# Patient Record
Sex: Male | Born: 1945 | Race: White | Hispanic: No | Marital: Single | Smoking: Current every day smoker
Health system: Southern US, Community
[De-identification: ages and names within clinical notes are randomized; demographics above are authoritative.]

## PROBLEM LIST (undated history)

## (undated) DIAGNOSIS — C4492 Squamous cell carcinoma of skin, unspecified: Secondary | ICD-10-CM

## (undated) DIAGNOSIS — E785 Hyperlipidemia, unspecified: Secondary | ICD-10-CM

## (undated) HISTORY — PX: HAND SURGERY: SHX662

## (undated) HISTORY — DX: Squamous cell carcinoma of skin, unspecified: C44.92

## (undated) HISTORY — PX: LEG SURGERY: SHX1003

---

## 2011-11-16 DIAGNOSIS — I2699 Other pulmonary embolism without acute cor pulmonale: Secondary | ICD-10-CM

## 2011-11-16 HISTORY — DX: Other pulmonary embolism without acute cor pulmonale: I26.99

## 2014-08-15 DIAGNOSIS — I219 Acute myocardial infarction, unspecified: Secondary | ICD-10-CM

## 2014-08-15 HISTORY — DX: Acute myocardial infarction, unspecified: I21.9

## 2014-08-15 HISTORY — PX: CARDIAC CATHETERIZATION: SHX172

## 2021-02-24 ENCOUNTER — Encounter: Payer: Self-pay | Admitting: Ophthalmology

## 2021-02-24 ENCOUNTER — Other Ambulatory Visit: Payer: Self-pay

## 2021-02-26 NOTE — Discharge Instructions (Signed)

## 2021-03-02 ENCOUNTER — Ambulatory Visit: Payer: Medicare HMO | Admitting: Anesthesiology

## 2021-03-02 ENCOUNTER — Other Ambulatory Visit: Payer: Self-pay

## 2021-03-02 ENCOUNTER — Ambulatory Visit
Admission: RE | Admit: 2021-03-02 | Discharge: 2021-03-02 | Disposition: A | Discharge status: Home / Self Care | Payer: Medicare HMO | Attending: Ophthalmology | Admitting: Ophthalmology

## 2021-03-02 ENCOUNTER — Encounter: Payer: Self-pay | Admitting: Ophthalmology

## 2021-03-02 ENCOUNTER — Encounter
Admission: RE | Disposition: A | Discharge status: Home / Self Care | Payer: Self-pay | Source: Home / Self Care | Attending: Ophthalmology

## 2021-03-02 DIAGNOSIS — F17210 Nicotine dependence, cigarettes, uncomplicated: Secondary | ICD-10-CM | POA: Insufficient documentation

## 2021-03-02 DIAGNOSIS — Z955 Presence of coronary angioplasty implant and graft: Secondary | ICD-10-CM | POA: Diagnosis not present

## 2021-03-02 DIAGNOSIS — Z7902 Long term (current) use of antithrombotics/antiplatelets: Secondary | ICD-10-CM | POA: Insufficient documentation

## 2021-03-02 DIAGNOSIS — Z86711 Personal history of pulmonary embolism: Secondary | ICD-10-CM | POA: Diagnosis not present

## 2021-03-02 DIAGNOSIS — H2512 Age-related nuclear cataract, left eye: Secondary | ICD-10-CM | POA: Insufficient documentation

## 2021-03-02 DIAGNOSIS — Z79899 Other long term (current) drug therapy: Secondary | ICD-10-CM | POA: Diagnosis not present

## 2021-03-02 HISTORY — PX: CATARACT EXTRACTION W/PHACO: SHX586

## 2021-03-02 SURGERY — PHACOEMULSIFICATION, CATARACT, WITH IOL INSERTION
Anesthesia: Monitor Anesthesia Care | Site: Eye | Laterality: Left

## 2021-03-02 MED ORDER — MOXIFLOXACIN HCL 0.5 % OP SOLN
OPHTHALMIC | Status: DC | PRN
  Administered 2021-03-02: 0.2 mL via OPHTHALMIC

## 2021-03-02 MED ORDER — MIDAZOLAM HCL 2 MG/2ML IJ SOLN
INTRAMUSCULAR | Status: DC | PRN
  Administered 2021-03-02: 2 mg via INTRAVENOUS

## 2021-03-02 MED ORDER — LACTATED RINGERS IV SOLN: INTRAVENOUS | Status: DC

## 2021-03-02 MED ORDER — OXYCODONE HCL 5 MG PO TABS: 5.0000 mg | ORAL_TABLET | Freq: Once | ORAL | Status: DC | PRN

## 2021-03-02 MED ORDER — ARMC OPHTHALMIC DILATING DROPS
1.0000 "application " | OPHTHALMIC | Status: DC | PRN
  Administered 2021-03-02 (×3): 1 via OPHTHALMIC

## 2021-03-02 MED ORDER — EPINEPHRINE PF 1 MG/ML IJ SOLN
INTRAOCULAR | Status: DC | PRN
  Administered 2021-03-02: 89 mL via OPHTHALMIC

## 2021-03-02 MED ORDER — OXYCODONE HCL 5 MG/5ML PO SOLN: 5.0000 mg | Freq: Once | ORAL | Status: DC | PRN

## 2021-03-02 MED ORDER — FENTANYL CITRATE (PF) 100 MCG/2ML IJ SOLN
INTRAMUSCULAR | Status: DC | PRN
  Administered 2021-03-02: 100 ug via INTRAVENOUS

## 2021-03-02 MED ORDER — SODIUM HYALURONATE 10 MG/ML IO SOLN
INTRAOCULAR | Status: DC | PRN
  Administered 2021-03-02: 0.55 mL via INTRAOCULAR

## 2021-03-02 MED ORDER — SODIUM HYALURONATE 23 MG/ML IO SOLN
INTRAOCULAR | Status: DC | PRN
  Administered 2021-03-02: 0.6 mL via INTRAOCULAR

## 2021-03-02 MED ORDER — TETRACAINE HCL 0.5 % OP SOLN
1.0000 [drp] | OPHTHALMIC | Status: DC | PRN
  Administered 2021-03-02 (×3): 1 [drp] via OPHTHALMIC

## 2021-03-02 MED ORDER — LIDOCAINE HCL (PF) 2 % IJ SOLN
INTRAOCULAR | Status: DC | PRN
  Administered 2021-03-02: 1 mL via INTRAOCULAR

## 2021-03-02 SURGICAL SUPPLY — 17 items
CANNULA ANT/CHMB 27GA (MISCELLANEOUS) ×4 IMPLANT
DISSECTOR HYDRO NUCLEUS 50X22 (MISCELLANEOUS) ×2 IMPLANT
GLOVE PI ULTRA LF STRL 7.5 (GLOVE) ×2 IMPLANT
GLOVE PI ULTRA NON LATEX 7.5 (GLOVE) ×2
GLOVE SURG SYN 8.5  E (GLOVE) ×1
GLOVE SURG SYN 8.5 E (GLOVE) ×1 IMPLANT
GOWN STRL REUS W/ TWL LRG LVL3 (GOWN DISPOSABLE) ×2 IMPLANT
GOWN STRL REUS W/TWL LRG LVL3 (GOWN DISPOSABLE) ×4
LENS IOL TECNIS EYHANCE 18.5 (Intraocular Lens) ×2 IMPLANT
MARKER SKIN DUAL TIP RULER LAB (MISCELLANEOUS) ×2 IMPLANT
PACK DR. KING ARMS (PACKS) ×2 IMPLANT
PACK EYE AFTER SURG (MISCELLANEOUS) ×2 IMPLANT
PACK OPTHALMIC (MISCELLANEOUS) ×2 IMPLANT
SYR 3ML LL SCALE MARK (SYRINGE) ×2 IMPLANT
SYR TB 1ML LUER SLIP (SYRINGE) ×2 IMPLANT
WATER STERILE IRR 250ML POUR (IV SOLUTION) ×2 IMPLANT
WIPE NON LINTING 3.25X3.25 (MISCELLANEOUS) ×2 IMPLANT

## 2021-03-02 NOTE — Op Note (Signed)
OPERATIVE NOTE  Gary Andersen [id number] 03/02/2021   PREOPERATIVE DIAGNOSIS:  Nuclear sclerotic cataract left eye.  H25.12   POSTOPERATIVE DIAGNOSIS:    Nuclear sclerotic cataract left eye.     PROCEDURE:  Phacoemusification with posterior chamber intraocular lens placement of the left eye   LENS:   Implant Name Type Inv. Item Serial No. Manufacturer Lot No. LRB No. Used Action  LENS IOL TECNIS EYHANCE 18.5 - [id number] Intraocular Lens LENS IOL TECNIS EYHANCE 18.5 [id number] JOHNSON   Left 1 Implanted      Procedure(s) with comments: CATARACT EXTRACTION PHACO AND INTRAOCULAR LENS PLACEMENT (IOC) LEFT (Left) - 2.21 0:21.8  DIB00 +18.5   ULTRASOUND TIME: 0 minutes 21 seconds.  CDE 2.21   SURGEON:  Willey Blade, MD, MPH   ANESTHESIA:  Topical with tetracaine drops augmented with 1% preservative-free intracameral lidocaine.  ESTIMATED BLOOD LOSS: <1 mL   COMPLICATIONS:  None.   DESCRIPTION OF PROCEDURE:  The patient was identified in the holding room and transported to the operating room and placed in the supine position under the operating microscope.  The left eye was identified as the operative eye and it was prepped and draped in the usual sterile ophthalmic fashion.   A 1.0 millimeter clear-corneal paracentesis was made at the 5:00 position. 0.5 ml of preservative-free 1% lidocaine with epinephrine was injected into the anterior chamber.  The anterior chamber was filled with Healon 5 viscoelastic.  A 2.4 millimeter keratome was used to make a near-clear corneal incision at the 2:00 position.  A curvilinear capsulorrhexis was made with a cystotome and capsulorrhexis forceps.  Balanced salt solution was used to hydrodissect and hydrodelineate the nucleus.   Phacoemulsification was then used in stop and chop fashion to remove the lens nucleus and epinucleus.  The remaining cortex was then removed using the irrigation and aspiration handpiece. Healon was then placed into the  capsular bag to distend it for lens placement.  A lens was then injected into the capsular bag.  The remaining viscoelastic was aspirated.   Wounds were hydrated with balanced salt solution.  The anterior chamber was inflated to a physiologic pressure with balanced salt solution.  Intracameral vigamox 0.1 mL undiltued was injected into the eye and a drop placed onto the ocular surface.  No wound leaks were noted.  The patient was taken to the recovery room in stable condition without complications of anesthesia or surgery  Willey Blade 03/02/2021, 8:48 AM

## 2021-03-02 NOTE — Anesthesia Procedure Notes (Signed)
Procedure Name: MAC Date/Time: 03/02/2021 8:30 AM Performed by: Silvana Newness, CRNA Pre-anesthesia Checklist: Patient identified, Emergency Drugs available, Suction available, Patient being monitored and Timeout performed Patient Re-evaluated:Patient Re-evaluated prior to induction Oxygen Delivery Method: Nasal cannula Placement Confirmation: positive ETCO2

## 2021-03-02 NOTE — Anesthesia Postprocedure Evaluation (Signed)
Anesthesia Post Note  Patient: Gary Andersen  Procedure(s) Performed: CATARACT EXTRACTION PHACO AND INTRAOCULAR LENS PLACEMENT (IOC) LEFT (Left Eye)     Patient location during evaluation: PACU Anesthesia Type: MAC Level of consciousness: awake and alert Pain management: pain level controlled Vital Signs Assessment: post-procedure vital signs reviewed and stable Respiratory status: spontaneous breathing, nonlabored ventilation, respiratory function stable and patient connected to nasal cannula oxygen Cardiovascular status: stable and blood pressure returned to baseline Postop Assessment: no apparent nausea or vomiting Anesthetic complications: no   No complications documented.  Fidel Levy

## 2021-03-02 NOTE — H&P (Signed)
[facility]   Primary Care Physician:  Armando Gang, FNP Ophthalmologist: Dr. Willey Blade  Pre-Procedure History & Physical: HPI:  Gary Andersen is a 75 y.o. male here for cataract surgery.   Past Medical History:  Diagnosis Date  . Myocardial infarction (HCC) 08/2014  . Pulmonary embolism (HCC) 2013    Past Surgical History:  Procedure Laterality Date  . CARDIAC CATHETERIZATION  08/2014   x3 between 08/2014 and 11/2014.  3 stents    Prior to Admission medications   Medication Sig Start Date End Date Taking? Authorizing Provider  atorvastatin (LIPITOR) 40 MG tablet Take 40 mg by mouth daily.   Yes [provider]  clopidogrel (PLAVIX) 75 MG tablet Take 75 mg by mouth daily.   Yes [provider]  finasteride (PROSCAR) 5 MG tablet Take 5 mg by mouth daily.   Yes [provider]    Allergies as of 01/29/2021  . (Not on File)    History reviewed. No pertinent family history.  Social History   Socioeconomic History  . Marital status: Single    Spouse name: Not on file  . Number of children: Not on file  . Years of education: Not on file  . Highest education level: Not on file  Occupational History  . Not on file  Tobacco Use  . Smoking status: Current Every Day Smoker    Packs/day: 1.00    Years: 63.00    Pack years: 63.00    Types: Cigarettes  . Smokeless tobacco: Never Used  . Tobacco comment: started age 32  Vaping Use  . Vaping Use: Never used  Substance and Sexual Activity  . Alcohol use: Not Currently  . Drug use: Not on file  . Sexual activity: Not on file  Other Topics Concern  . Not on file  Social History Narrative  . Not on file   Social Determinants of Health   Financial Resource Strain: Not on file  Food Insecurity: Not on file  Transportation Needs: Not on file  Physical Activity: Not on file  Stress: Not on file  Social Connections: Not on file  Intimate Partner Violence: Not on file    Review  of Systems: See HPI, otherwise negative ROS  Physical Exam: BP 131/75   Pulse (!) 57   Temp (!) 97 F (36.1 C) (Temporal)   Resp 18   Ht 5' 11.73" (1.822 m)   Wt 89.4 kg   SpO2 99%   BMI 26.92 kg/m  General:   Alert,  pleasant and cooperative in NAD Head:  Normocephalic and atraumatic. Respiratory:  Normal work of breathing. Cardiovascular:  RRR  Impression/Plan: Gary Andersen is here for cataract surgery.  Risks, benefits, limitations, and alternatives regarding cataract surgery have been reviewed with the patient.  Questions have been answered.  All parties agreeable.   Willey Blade, MD  03/02/2021, 8:19 AM

## 2021-03-02 NOTE — Transfer of Care (Signed)
Immediate Anesthesia Transfer of Care Note  Patient: Gary Andersen  Procedure(s) Performed: CATARACT EXTRACTION PHACO AND INTRAOCULAR LENS PLACEMENT (IOC) LEFT (Left Eye)  Patient Location: PACU  Anesthesia Type: MAC  Level of Consciousness: awake, alert  and patient cooperative  Airway and Oxygen Therapy: Patient Spontanous Breathing and Patient connected to supplemental oxygen  Post-op Assessment: Post-op Vital signs reviewed, Patient's Cardiovascular Status Stable, Respiratory Function Stable, Patent Airway and No signs of Nausea or vomiting  Post-op Vital Signs: Reviewed and stable  Complications: No complications documented.

## 2021-03-02 NOTE — Anesthesia Preprocedure Evaluation (Signed)
Anesthesia Evaluation  Patient identified by MRN, date of birth, ID band Patient awake    Reviewed: NPO status   Airway Mallampati: II  TM Distance: >3 FB Neck ROM: full    Dental  (+) Edentulous Upper, Edentulous Lower +Beard:   Pulmonary Current Smoker and Patient abstained from smoking.,    Pulmonary exam normal        Cardiovascular Exercise Tolerance: Good + Past MI (2014), + Cardiac Stents (x3 2015) and + DVT (PE 2013)  Normal cardiovascular exam     Neuro/Psych negative neurological ROS  negative psych ROS   GI/Hepatic negative GI ROS, Neg liver ROS,   Endo/Other  Morbid obesity (bmi 27)  Renal/GU negative Renal ROS  negative genitourinary   Musculoskeletal   Abdominal   Peds  Hematology negative hematology ROS (+)   Anesthesia Other Findings Last plavix: 4/18   Reproductive/Obstetrics                             Anesthesia Physical Anesthesia Plan  ASA: III  Anesthesia Plan: MAC   Post-op Pain Management:    Induction:   PONV Risk Score and Plan: 1 and Midazolam  Airway Management Planned:   Additional Equipment:   Intra-op Plan:   Post-operative Plan:   Informed Consent: I have reviewed the patients History and Physical, chart, labs and discussed the procedure including the risks, benefits and alternatives for the proposed anesthesia with the patient or authorized representative who has indicated his/her understanding and acceptance.       Plan Discussed with: CRNA  Anesthesia Plan Comments:         Anesthesia Quick Evaluation

## 2021-03-03 ENCOUNTER — Encounter: Payer: Self-pay | Admitting: Ophthalmology

## 2021-03-05 ENCOUNTER — Other Ambulatory Visit: Payer: Self-pay

## 2021-03-05 ENCOUNTER — Encounter: Payer: Self-pay | Admitting: Ophthalmology

## 2021-03-12 NOTE — Discharge Instructions (Signed)

## 2021-03-16 ENCOUNTER — Ambulatory Visit
Admission: RE | Admit: 2021-03-16 | Discharge: 2021-03-16 | Disposition: A | Discharge status: Home / Self Care | Payer: Medicare HMO | Attending: Ophthalmology | Admitting: Ophthalmology

## 2021-03-16 ENCOUNTER — Ambulatory Visit: Payer: Medicare HMO | Admitting: Anesthesiology

## 2021-03-16 ENCOUNTER — Encounter
Admission: RE | Disposition: A | Discharge status: Home / Self Care | Payer: Self-pay | Source: Home / Self Care | Attending: Ophthalmology

## 2021-03-16 ENCOUNTER — Other Ambulatory Visit: Payer: Self-pay

## 2021-03-16 ENCOUNTER — Encounter: Payer: Self-pay | Admitting: Ophthalmology

## 2021-03-16 DIAGNOSIS — F17210 Nicotine dependence, cigarettes, uncomplicated: Secondary | ICD-10-CM | POA: Diagnosis not present

## 2021-03-16 DIAGNOSIS — H2511 Age-related nuclear cataract, right eye: Secondary | ICD-10-CM | POA: Insufficient documentation

## 2021-03-16 DIAGNOSIS — Z86711 Personal history of pulmonary embolism: Secondary | ICD-10-CM | POA: Diagnosis not present

## 2021-03-16 DIAGNOSIS — Z7902 Long term (current) use of antithrombotics/antiplatelets: Secondary | ICD-10-CM | POA: Diagnosis not present

## 2021-03-16 DIAGNOSIS — Z79899 Other long term (current) drug therapy: Secondary | ICD-10-CM | POA: Insufficient documentation

## 2021-03-16 HISTORY — PX: CATARACT EXTRACTION W/PHACO: SHX586

## 2021-03-16 SURGERY — PHACOEMULSIFICATION, CATARACT, WITH IOL INSERTION
Anesthesia: Monitor Anesthesia Care | Site: Eye | Laterality: Right

## 2021-03-16 MED ORDER — FENTANYL CITRATE (PF) 100 MCG/2ML IJ SOLN
INTRAMUSCULAR | Status: DC | PRN
  Administered 2021-03-16 (×2): 50 ug via INTRAVENOUS

## 2021-03-16 MED ORDER — ONDANSETRON HCL 4 MG/2ML IJ SOLN: 4.0000 mg | Freq: Once | INTRAMUSCULAR | Status: DC | PRN

## 2021-03-16 MED ORDER — SODIUM HYALURONATE 23MG/ML IO SOSY
PREFILLED_SYRINGE | INTRAOCULAR | Status: DC | PRN
  Administered 2021-03-16: 0.6 mL via INTRAOCULAR

## 2021-03-16 MED ORDER — TETRACAINE HCL 0.5 % OP SOLN
1.0000 [drp] | OPHTHALMIC | Status: DC | PRN
  Administered 2021-03-16 (×3): 1 [drp] via OPHTHALMIC

## 2021-03-16 MED ORDER — ARMC OPHTHALMIC DILATING DROPS
1.0000 "application " | OPHTHALMIC | Status: DC | PRN
  Administered 2021-03-16 (×3): 1 via OPHTHALMIC

## 2021-03-16 MED ORDER — ACETAMINOPHEN 160 MG/5ML PO SOLN: 325.0000 mg | ORAL | Status: DC | PRN

## 2021-03-16 MED ORDER — ACETAMINOPHEN 325 MG PO TABS: 325.0000 mg | ORAL_TABLET | ORAL | Status: DC | PRN

## 2021-03-16 MED ORDER — LIDOCAINE HCL (PF) 2 % IJ SOLN
INTRAOCULAR | Status: DC | PRN
  Administered 2021-03-16: 1 mL via INTRAOCULAR

## 2021-03-16 MED ORDER — MIDAZOLAM HCL 2 MG/2ML IJ SOLN
INTRAMUSCULAR | Status: DC | PRN
  Administered 2021-03-16: 2 mg via INTRAVENOUS

## 2021-03-16 MED ORDER — EPINEPHRINE PF 1 MG/ML IJ SOLN
INTRAOCULAR | Status: DC | PRN
  Administered 2021-03-16: 80 mL via OPHTHALMIC

## 2021-03-16 MED ORDER — SODIUM HYALURONATE 10 MG/ML IO SOLUTION
PREFILLED_SYRINGE | INTRAOCULAR | Status: DC | PRN
  Administered 2021-03-16: 0.55 mL via INTRAOCULAR

## 2021-03-16 MED ORDER — MOXIFLOXACIN HCL 0.5 % OP SOLN
OPHTHALMIC | Status: DC | PRN
  Administered 2021-03-16: 0.2 mL via OPHTHALMIC

## 2021-03-16 SURGICAL SUPPLY — 17 items
CANNULA ANT/CHMB 27GA (MISCELLANEOUS) ×4 IMPLANT
DISSECTOR HYDRO NUCLEUS 50X22 (MISCELLANEOUS) ×2 IMPLANT
GLOVE PI ULTRA LF STRL 7.5 (GLOVE) ×2 IMPLANT
GLOVE PI ULTRA NON LATEX 7.5 (GLOVE) ×2
GLOVE SURG SYN 8.5  E (GLOVE) ×1
GLOVE SURG SYN 8.5 E (GLOVE) ×1 IMPLANT
GOWN STRL REUS W/ TWL LRG LVL3 (GOWN DISPOSABLE) ×2 IMPLANT
GOWN STRL REUS W/TWL LRG LVL3 (GOWN DISPOSABLE) ×4
LENS IOL TECNIS EYHANCE 19.0 (Intraocular Lens) ×2 IMPLANT
MARKER SKIN DUAL TIP RULER LAB (MISCELLANEOUS) ×2 IMPLANT
PACK DR. KING ARMS (PACKS) ×2 IMPLANT
PACK EYE AFTER SURG (MISCELLANEOUS) ×2 IMPLANT
PACK OPTHALMIC (MISCELLANEOUS) ×2 IMPLANT
SYR 3ML LL SCALE MARK (SYRINGE) ×2 IMPLANT
SYR TB 1ML LUER SLIP (SYRINGE) ×2 IMPLANT
WATER STERILE IRR 250ML POUR (IV SOLUTION) ×2 IMPLANT
WIPE NON LINTING 3.25X3.25 (MISCELLANEOUS) ×2 IMPLANT

## 2021-03-16 NOTE — Anesthesia Postprocedure Evaluation (Signed)
Anesthesia Post Note  Patient: Gary Andersen  Procedure(s) Performed: CATARACT EXTRACTION PHACO AND INTRAOCULAR LENS PLACEMENT (IOC)  RIGHT (Right Eye)     Patient location during evaluation: PACU Anesthesia Type: MAC Level of consciousness: awake Pain management: pain level controlled Vital Signs Assessment: post-procedure vital signs reviewed and stable Respiratory status: respiratory function stable Cardiovascular status: stable Postop Assessment: no signs of nausea or vomiting Anesthetic complications: no   No complications documented.  Veda Canning

## 2021-03-16 NOTE — H&P (Signed)
[facility]   Primary Care Physician:  Armando Gang, FNP Ophthalmologist: Dr. Willey Blade  Pre-Procedure History & Physical: HPI:  Gary Andersen is a 75 y.o. male here for cataract surgery.   Past Medical History:  Diagnosis Date  . Myocardial infarction (HCC) 08/2014  . Pulmonary embolism (HCC) 2013    Past Surgical History:  Procedure Laterality Date  . CARDIAC CATHETERIZATION  08/2014   x3 between 08/2014 and 11/2014.  3 stents  . CATARACT EXTRACTION W/PHACO Left 03/02/2021   Procedure: CATARACT EXTRACTION PHACO AND INTRAOCULAR LENS PLACEMENT (IOC) LEFT;  Surgeon: Nevada Crane, MD;  Location: [facility];  Service: Ophthalmology;  Laterality: Left;  2.21 0:21.8    Prior to Admission medications   Medication Sig Start Date End Date Taking? Authorizing Provider  atorvastatin (LIPITOR) 40 MG tablet Take 40 mg by mouth daily.   Yes [provider]  clopidogrel (PLAVIX) 75 MG tablet Take 75 mg by mouth daily.   Yes [provider]  finasteride (PROSCAR) 5 MG tablet Take 5 mg by mouth daily.   Yes [provider]    Allergies as of 01/29/2021  . (Not on File)    History reviewed. No pertinent family history.  Social History   Socioeconomic History  . Marital status: Single    Spouse name: Not on file  . Number of children: Not on file  . Years of education: Not on file  . Highest education level: Not on file  Occupational History  . Not on file  Tobacco Use  . Smoking status: Current Every Day Smoker    Packs/day: 1.00    Years: 63.00    Pack years: 63.00    Types: Cigarettes  . Smokeless tobacco: Never Used  . Tobacco comment: started age 60  Vaping Use  . Vaping Use: Never used  Substance and Sexual Activity  . Alcohol use: Not Currently  . Drug use: Not on file  . Sexual activity: Not on file  Other Topics Concern  . Not on file  Social History Narrative  . Not on file   Social Determinants of  Health   Financial Resource Strain: Not on file  Food Insecurity: Not on file  Transportation Needs: Not on file  Physical Activity: Not on file  Stress: Not on file  Social Connections: Not on file  Intimate Partner Violence: Not on file    Review of Systems: See HPI, otherwise negative ROS  Physical Exam: BP 129/71   Pulse (!) 51   Temp 97.8 F (36.6 C) (Temporal)   Resp 18   Ht 5\' 11"  (1.803 m)   Wt 87.9 kg   SpO2 99%   BMI 27.02 kg/m  General:   Alert,  pleasant and cooperative in NAD Head:  Normocephalic and atraumatic. Respiratory:  Normal work of breathing. Cardiovascular:  RRR  Impression/Plan: is here for cataract surgery.  Risks, benefits, limitations, and alternatives regarding cataract surgery have been reviewed with the patient.  Questions have been answered.  All parties agreeable.   Karlton Lemon, MD  03/16/2021, 10:35 AM

## 2021-03-16 NOTE — Anesthesia Procedure Notes (Signed)
Procedure Name: MAC Date/Time: 03/16/2021 10:48 AM Performed by: Jeannene Patella, CRNA Pre-anesthesia Checklist: Patient identified, Emergency Drugs available, Suction available, Timeout performed and Patient being monitored Patient Re-evaluated:Patient Re-evaluated prior to induction Oxygen Delivery Method: Nasal cannula Placement Confirmation: positive ETCO2

## 2021-03-16 NOTE — Anesthesia Preprocedure Evaluation (Signed)
Anesthesia Evaluation  Patient identified by MRN, date of birth, ID band Patient awake    Reviewed: NPO status   Airway Mallampati: II  TM Distance: >3 FB Neck ROM: full    Dental  (+) Edentulous Upper, Edentulous Lower +Beard:   Pulmonary Current Smoker and Patient abstained from smoking.,    Pulmonary exam normal        Cardiovascular Exercise Tolerance: Good + Past MI (2014), + Cardiac Stents (x3 2015) and + DVT (PE 2013)  Normal cardiovascular exam     Neuro/Psych    GI/Hepatic   Endo/Other  Morbid obesity (bmi 27)  Renal/GU      Musculoskeletal   Abdominal   Peds  Hematology   Anesthesia Other Findings Last plavix: 4/18   Reproductive/Obstetrics                             Anesthesia Physical  Anesthesia Plan  ASA: III  Anesthesia Plan: MAC   Post-op Pain Management:    Induction:   PONV Risk Score and Plan: 1 and Midazolam and Treatment may vary due to age or medical condition  Airway Management Planned: Natural Airway and Nasal Cannula  Additional Equipment:   Intra-op Plan:   Post-operative Plan:   Informed Consent: I have reviewed the patients History and Physical, chart, labs and discussed the procedure including the risks, benefits and alternatives for the proposed anesthesia with the patient or authorized representative who has indicated his/her understanding and acceptance.       Plan Discussed with: CRNA  Anesthesia Plan Comments:         Anesthesia Quick Evaluation

## 2021-03-16 NOTE — Transfer of Care (Signed)
Immediate Anesthesia Transfer of Care Note  Patient: Gary Andersen  Procedure(s) Performed: CATARACT EXTRACTION PHACO AND INTRAOCULAR LENS PLACEMENT (IOC)  RIGHT (Right Eye)  Patient Location: PACU  Anesthesia Type: MAC  Level of Consciousness: awake, alert  and patient cooperative  Airway and Oxygen Therapy: Patient Spontanous Breathing and Patient connected to supplemental oxygen  Post-op Assessment: Post-op Vital signs reviewed, Patient's Cardiovascular Status Stable, Respiratory Function Stable, Patent Airway and No signs of Nausea or vomiting  Post-op Vital Signs: Reviewed and stable  Complications: No complications documented.

## 2021-03-16 NOTE — Op Note (Signed)
OPERATIVE NOTE  Agapito Hanway [id number] 03/16/2021   PREOPERATIVE DIAGNOSIS:  Nuclear sclerotic cataract right eye.  H25.11   POSTOPERATIVE DIAGNOSIS:    Nuclear sclerotic cataract right eye.     PROCEDURE:  Phacoemusification with posterior chamber intraocular lens placement of the right eye   LENS:   Implant Name Type Inv. Item Serial No. Manufacturer Lot No. LRB No. Used Action  LENS IOL TECNIS EYHANCE 19.0 - [id number] Intraocular Lens LENS IOL TECNIS EYHANCE 19.0 [id number] [id number]   Right 1 Implanted       Procedure(s) with comments: CATARACT EXTRACTION PHACO AND INTRAOCULAR LENS PLACEMENT (IOC)  RIGHT (Right) - 3.11 0:27.4  DIB00 +19.0   ULTRASOUND TIME: 0 minutes 27 seconds.  CDE 3.11   SURGEON:  Willey Blade, MD, MPH  ANESTHESIOLOGIST: Anesthesiologist: Jola Babinski, MD CRNA: Jinny Blossom, CRNA   ANESTHESIA:  Topical with tetracaine drops augmented with 1% preservative-free intracameral lidocaine.  ESTIMATED BLOOD LOSS: less than 1 mL.   COMPLICATIONS:  None.   DESCRIPTION OF PROCEDURE:  The patient was identified in the holding room and transported to the operating room and placed in the supine position under the operating microscope.  The right eye was identified as the operative eye and it was prepped and draped in the usual sterile ophthalmic fashion.   A 1.0 millimeter clear-corneal paracentesis was made at the 10:30 position. 0.5 ml of preservative-free 1% lidocaine with epinephrine was injected into the anterior chamber.  The anterior chamber was filled with Healon 5 viscoelastic.  A 2.4 millimeter keratome was used to make a near-clear corneal incision at the 8:00 position.  A curvilinear capsulorrhexis was made with a cystotome and capsulorrhexis forceps.  Balanced salt solution was used to hydrodissect and hydrodelineate the nucleus.   Phacoemulsification was then used in stop and chop fashion to remove the lens nucleus and epinucleus.  The  remaining cortex was then removed using the irrigation and aspiration handpiece. Healon was then placed into the capsular bag to distend it for lens placement.  A lens was then injected into the capsular bag.  The remaining viscoelastic was aspirated.   Wounds were hydrated with balanced salt solution.  The anterior chamber was inflated to a physiologic pressure with balanced salt solution.   Intracameral vigamox 0.1 mL undiluted was injected into the eye and a drop placed onto the ocular surface.  No wound leaks were noted.  The patient was taken to the recovery room in stable condition without complications of anesthesia or surgery  Willey Blade 03/16/2021, 11:04 AM

## 2021-03-17 ENCOUNTER — Encounter: Payer: Self-pay | Admitting: Ophthalmology

## 2021-05-10 ENCOUNTER — Emergency Department: Payer: Medicare HMO

## 2021-05-10 ENCOUNTER — Emergency Department
Admission: EM | Admit: 2021-05-10 | Discharge: 2021-05-10 | Discharge status: Against Medical Advice | Payer: Medicare HMO | Attending: Student in an Organized Health Care Education/Training Program | Admitting: Student in an Organized Health Care Education/Training Program

## 2021-05-10 ENCOUNTER — Other Ambulatory Visit: Payer: Self-pay

## 2021-05-10 ENCOUNTER — Encounter: Payer: Self-pay | Admitting: Emergency Medicine

## 2021-05-10 DIAGNOSIS — R0789 Other chest pain: Secondary | ICD-10-CM | POA: Diagnosis not present

## 2021-05-10 DIAGNOSIS — R079 Chest pain, unspecified: Secondary | ICD-10-CM | POA: Diagnosis present

## 2021-05-10 DIAGNOSIS — M549 Dorsalgia, unspecified: Secondary | ICD-10-CM | POA: Insufficient documentation

## 2021-05-10 DIAGNOSIS — F17210 Nicotine dependence, cigarettes, uncomplicated: Secondary | ICD-10-CM | POA: Insufficient documentation

## 2021-05-10 LAB — CBC
HCT: 44.7 % (ref 39.0–52.0)
Hemoglobin: 15.4 g/dL (ref 13.0–17.0)
MCH: 30.7 pg (ref 26.0–34.0)
MCHC: 34.5 g/dL (ref 30.0–36.0)
MCV: 89 fL (ref 80.0–100.0)
Platelets: 209 10*3/uL (ref 150–400)
RBC: 5.02 MIL/uL (ref 4.22–5.81)
RDW: 12.4 % (ref 11.5–15.5)
WBC: 6.5 10*3/uL (ref 4.0–10.5)
nRBC: 0 % (ref 0.0–0.2)

## 2021-05-10 LAB — BASIC METABOLIC PANEL
Anion gap: 6 (ref 5–15)
BUN: 18 mg/dL (ref 8–23)
CO2: 26 mmol/L (ref 22–32)
Calcium: 9.2 mg/dL (ref 8.9–10.3)
Chloride: 105 mmol/L (ref 98–111)
Creatinine, Ser: 1.29 mg/dL — ABNORMAL HIGH (ref 0.61–1.24)
GFR, Estimated: 57 mL/min — ABNORMAL LOW (ref >60)
Glucose, Bld: 190 mg/dL — ABNORMAL HIGH (ref 70–99)
Potassium: 4.2 mmol/L (ref 3.5–5.1)
Sodium: 137 mmol/L (ref 135–145)

## 2021-05-10 LAB — TROPONIN I (HIGH SENSITIVITY)
Troponin I (High Sensitivity): 5 ng/L (ref <18)
Troponin I (High Sensitivity): 4 ng/L (ref <18)

## 2021-05-10 MED ORDER — IOHEXOL 350 MG/ML SOLN
100.0000 mL | Freq: Once | INTRAVENOUS | Status: AC | PRN
  Administered 2021-05-10: 100 mL via INTRAVENOUS

## 2021-05-10 MED ORDER — ALUM & MAG HYDROXIDE-SIMETH 200-200-20 MG/5ML PO SUSP
30.0000 mL | Freq: Once | ORAL | Status: AC
  Administered 2021-05-10: 30 mL via ORAL
  Filled 2021-05-10: qty 30

## 2021-05-10 MED ORDER — LIDOCAINE VISCOUS HCL 2 % MT SOLN
15.0000 mL | Freq: Once | OROMUCOSAL | Status: AC
  Administered 2021-05-10: 15 mL via ORAL
  Filled 2021-05-10: qty 15

## 2021-05-10 NOTE — Discharge Instructions (Addendum)
  IMPRESSION: 1. No evidence of aortic aneurysm, dissection, or other acute aortic pathology. 2. 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 3. Coronary artery disease. 4. Emphysema. 5. Small hiatal hernia. 6. Small dependent bladder calculi. No renal calculi, ureteral calculi, or hydronephrosis.   Aortic Atherosclerosis (ICD10-I70.0) and Emphysema (ICD10-J43.9).     Electronically Signed   By: Lauralyn Primes M.D.   On: 05/10/2021 18:20  Based on your presentation I recommend that you stay for cardiology consultation.  You have chosen to leave against medical advice.  You are at high risk for significant cardiac or vascular event based on your cardiac history and the CT findings from today that could result in worsening pain, disability or in worst case death based on your presentation.  Please follow up in Dr. Roslynn Amble clinic first thing in the morning.

## 2021-05-10 NOTE — ED Triage Notes (Signed)
Pt reports last pm started with a burning pain to his mid upper back. Pt states the pain is deep within. Pt reports hx of 2 MI's and states he has cardiac stents in place. Pt reports in 2015 he had a stent slip out of place and the pain was the same so he came.

## 2021-05-10 NOTE — ED Provider Notes (Signed)
[facility] Emergency Department Provider Note  ____________________________________________  Time seen: Approximately 5:29 PM  I have reviewed the triage vital signs and the nursing notes.   HISTORY  Chief Complaint Back Pain and Chest Pain    HPI Gary Andersen is a 75 y.o. male who presents emergency department complaining of a burning sensation in his back "behind my heart."  Patient has a cardiac history with a history of an MI and has had multiple stents placed.  He had complication with one of his stents 7 years ago where it slipped and occluded one of the vessels and had to have a revision.  Since then patient has been largely asymptomatic.  He had a recent follow-up with his cardiologist and states that he was doing the stress test but could not complete the entire course just due to the length of time however there had been no acute findings.  Patient states that he woke up this morning with a burning sensation behind his heart.  He was not sure whether this may have been his back, reflux or even his heart so he presents for evaluation.  No URI symptoms.  No emesis or other GI complaints.  No medication for his complaint prior to arrival.       Past Medical History:  Diagnosis Date   Myocardial infarction (HCC) 08/2014   Pulmonary embolism (HCC) 2013    There are no problems to display for this patient.   Past Surgical History:  Procedure Laterality Date   CARDIAC CATHETERIZATION  08/2014   x3 between 08/2014 and 11/2014.  3 stents   CATARACT EXTRACTION W/PHACO Left 03/02/2021   Procedure: CATARACT EXTRACTION PHACO AND INTRAOCULAR LENS PLACEMENT (IOC) LEFT;  Surgeon: Nevada Crane, MD;  Location: [facility];  Service: Ophthalmology;  Laterality: Left;  2.21 0:21.8   CATARACT EXTRACTION W/PHACO Right 03/16/2021   Procedure: CATARACT EXTRACTION PHACO AND INTRAOCULAR LENS PLACEMENT (IOC)  RIGHT;  Surgeon: Nevada Crane, MD;  Location:  [facility];  Service: Ophthalmology;  Laterality: Right;  3.11 0:27.4    Prior to Admission medications   Medication Sig Start Date End Date Taking? Authorizing Provider  atorvastatin (LIPITOR) 40 MG tablet Take 40 mg by mouth daily.    [provider]  clopidogrel (PLAVIX) 75 MG tablet Take 75 mg by mouth daily.    [provider]  finasteride (PROSCAR) 5 MG tablet Take 5 mg by mouth daily.    [provider]    Allergies Patient has no known allergies.  No family history on file.  Social History Social History   Tobacco Use   Smoking status: Every Day    Packs/day: 1.00    Years: 63.00    Pack years: 63.00    Types: Cigarettes   Smokeless tobacco: Never   Tobacco comments:    started age 34  Vaping Use   Vaping Use: Never used  Substance Use Topics   Alcohol use: Not Currently     Review of Systems  Constitutional: No fever/chills Eyes: No visual changes. No discharge ENT: No upper respiratory complaints. Cardiovascular: no chest pain.  Positive for back pain "behind my heart." Respiratory: no cough. No SOB. Gastrointestinal: No abdominal pain.  No nausea, no vomiting.  No diarrhea.  No constipation. Genitourinary: Negative for dysuria. No hematuria Musculoskeletal: Negative for musculoskeletal pain. Skin: Negative for rash, abrasions, lacerations, ecchymosis. Neurological: Negative for headaches, focal weakness or numbness.  10 System ROS otherwise negative.  ____________________________________________   PHYSICAL EXAM:  VITAL SIGNS: ED Triage Vitals  Enc Vitals Group     BP 05/10/21 1705 127/85     Pulse Rate 05/10/21 1705 (!) 56     Resp 05/10/21 1705 16     Temp 05/10/21 1728 97.8 F (36.6 C)     Temp Source 05/10/21 1728 Oral     SpO2 05/10/21 1705 100 %     Weight 05/10/21 1446 195 lb (88.5 kg)     Height 05/10/21 1446 6' (1.829 m)     Head Circumference --      Peak Flow --      Pain Score 05/10/21 1446 3      Pain Loc --      Pain Edu? --      Excl. in GC? --      Constitutional: Alert and oriented. Well appearing and in no acute distress. Eyes: Conjunctivae are normal. PERRL. EOMI. Head: Atraumatic. ENT:      Ears:       Nose: No congestion/rhinnorhea.      Mouth/Throat: Mucous membranes are moist.  Neck: No stridor.  Hematological/Lymphatic/Immunilogical: No cervical lymphadenopathy. Cardiovascular: Normal rate, regular rhythm. Normal S1 and S2.  No appreciable murmurs, rubs, gallops.  Good peripheral circulation. Respiratory: Normal respiratory effort without tachypnea or retractions. Lungs CTAB. Good air entry to the bases with no decreased or absent breath sounds. Gastrointestinal: Bowel sounds 4 quadrants. Soft and nontender to palpation. No guarding or rigidity. No palpable masses. No distention. No CVA tenderness. Musculoskeletal: Full range of motion to all extremities. No gross deformities appreciated. Neurologic:  Normal speech and language. No gross focal neurologic deficits are appreciated.  Skin:  Skin is warm, dry and intact. No rash noted. Psychiatric: Mood and affect are normal. Speech and behavior are normal. Patient exhibits appropriate insight and judgement.   ____________________________________________   LABS (all labs ordered are listed, but only abnormal results are displayed)  Labs Reviewed  BASIC METABOLIC PANEL - Abnormal; Notable for the following components:      Result Value   Glucose, Bld 190 (*)    Creatinine, Ser 1.29 (*)    GFR, Estimated 57 (*)    All other components within normal limits  CBC  TROPONIN I (HIGH SENSITIVITY)  TROPONIN I (HIGH SENSITIVITY)   ____________________________________________  EKG   ____________________________________________  RADIOLOGY I personally viewed and evaluated these images as part of my medical decision making, as well as reviewing the written report by the radiologist.  ED Provider  Interpretation: Chest x-ray with no acute cardiopulmonary disease.  CT angio reveals no evidence of aneurysm or dissection.  Aortic atherosclerosis with a small mural thrombus identified.  DG Chest 2 View  Result Date: 05/10/2021 CLINICAL DATA:  Burning pain to the mid and upper back. EXAM: CHEST - 2 VIEW COMPARISON:  None. FINDINGS: The lungs are hyperinflated. A calcified lung nodule is seen within the lateral aspect of the mid to upper right lung. There is no evidence of acute infiltrate, pleural effusion or pneumothorax. The heart size and mediastinal contours are within normal limits. The visualized skeletal structures are unremarkable. IMPRESSION: No active cardiopulmonary disease. Electronically Signed   By: Aram Candela M.D.   On: 05/10/2021 15:40   CT Angio Chest/Abd/Pel for Dissection W and/or Wo Contrast  Result Date: 05/10/2021 CLINICAL DATA:  Chest pain, aortic dissection suspected EXAM: CT ANGIOGRAPHY CHEST, ABDOMEN AND PELVIS TECHNIQUE: Non-contrast CT of the chest was initially obtained. Multidetector CT imaging  through the chest, abdomen and pelvis was performed using the standard protocol during bolus administration of intravenous contrast. Multiplanar reconstructed images and MIPs were obtained and reviewed to evaluate the vascular anatomy. CONTRAST:  OMNIPAQUE IOHEXOL 350 MG/ML SOLN COMPARISON:  None. FINDINGS: CTA CHEST FINDINGS Cardiovascular: Preferential opacification of the thoracic aorta. Normal contour and caliber of the thoracic aorta. No evidence of aneurysm, dissection, or other acute aortic pathology. Moderate mixed calcific atherosclerosis including focal irregular mural thrombus of the posterior aspect of the lower lobes descending thoracic aorta (series 5, image 67). No pulmonary embolism on non tailored examination. Normal heart size. Left and right coronary artery stent. No pericardial effusion. Mediastinum/Nodes: No enlarged mediastinal, hilar, or axillary  lymph nodes. Small hiatal hernia. Calcified pretracheal and right hilar lymph nodes. Small hiatal hernia. Thyroid gland, trachea, and esophagus demonstrate no significant findings. Lungs/Pleura: Moderate centrilobular emphysema. Benign calcified nodule of the right upper lobe (series 6, image 54). No pleural effusion or pneumothorax. Musculoskeletal: No chest wall abnormality. No acute or significant osseous findings. Review of the MIP images confirms the above findings. CTA ABDOMEN AND PELVIS FINDINGS VASCULAR Normal caliber of the abdominal aorta with moderate to severe mixed calcific atherosclerosis. No evidence of aneurysm, dissection, or other acute aortic pathology. There is standard branching pattern of the abdominal aorta, with solitary bilateral renal arteries. There is mixed atherosclerosis of the branch vessel origins without high-grade stenosis. Review of the MIP images confirms the above findings. NON-VASCULAR Hepatobiliary: No solid liver abnormality is seen. No gallstones, gallbladder wall thickening, or biliary dilatation. Pancreas: Unremarkable. No pancreatic ductal dilatation or surrounding inflammatory changes. Spleen: Normal in size without significant abnormality. Adrenals/Urinary Tract: Adrenal glands are unremarkable. Kidneys are normal, without renal calculi, solid lesion, or hydronephrosis. Small dependent bladder calculi. Small bladder diverticula. Stomach/Bowel: Stomach is within normal limits. Appendix appears normal. No evidence of bowel wall thickening, distention, or inflammatory changes. Sigmoid diverticulosis. Lymphatic: No enlarged abdominal or pelvic lymph nodes. Reproductive: Mild prostatomegaly. Other: No abdominal wall hernia or abnormality. No abdominopelvic ascites. Musculoskeletal: No acute or significant osseous findings. Review of the MIP images confirms the above findings. IMPRESSION: 1. No evidence of aortic aneurysm, dissection, or other acute aortic pathology. 2.  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 3. Coronary artery disease. 4. Emphysema. 5. Small hiatal hernia. 6. Small dependent bladder calculi. No renal calculi, ureteral calculi, or hydronephrosis. Aortic Atherosclerosis (ICD10-I70.0) and Emphysema (ICD10-J43.9). Electronically Signed   By: Lauralyn Primes M.D.   On: 05/10/2021 18:20    ____________________________________________    PROCEDURES  Procedure(s) performed:    Procedures    Medications  alum & mag hydroxide-simeth (MAALOX/MYLANTA) 200-200-20 MG/5ML suspension 30 mL (30 mLs Oral Given 05/10/21 1744)    And  lidocaine (XYLOCAINE) 2 % viscous mouth solution 15 mL (15 mLs Oral Given 05/10/21 1744)  iohexol (OMNIPAQUE) 350 MG/ML injection 100 mL (100 mLs Intravenous Contrast Given 05/10/21 1755)     ____________________________________________   INITIAL IMPRESSION / ASSESSMENT AND PLAN / ED COURSE  Pertinent labs & imaging results that were available during my care of the patient were reviewed by me and considered in my medical decision making (see chart for details).  Review of the [place] CSRS was performed in accordance of the [facility] prior to dispensing any controlled drugs.           Patient's diagnosis is consistent with nonspecific chest pain.  Patient came in complaining of pain in his back that he describes as a burning sensation.  Describes it as being "behind his heart." EKG, troponin is reassuring.  Chest x-ray was reassuring.  Get up and the pain between the shoulder blades and the description CT angio was ordered with no evidence of aneurysm or dissection.  Small mural thrombus identified in the aorta.  While awaiting results patient was given GI cocktail which resolved his symptoms completely.  Suspect there was a component of GERD though given the patient's history also concern for underlying cardiac  involvement and patient's complicated chest pain.  I discussed the results with the patient including observation overnight.  Patient was primarily interested in close follow-up with his cardiologist.  Patient is adamant that he would like to go home as the results were largely reassuring.  My attending also discussed the results and his concerns with the patient regarding the symptoms and his cardiac history.  He still adamant that he would like to be discharged.  Strict return precautions and patient verbalizes understanding of same.  Patient states that he will talk to his cardiologist in the morning.  If he has any return of pain he will return to the emergency department.  However as he is asymptomatic, results have been largely reassuring even though there was still some concern for cardiac involvement given his history he would like to be at home this evening.  Patient is also concerned as his wife cannot drive a dark and I think this may have a component into the patient's desire to go home.  Again both myself and my attending have discussed our concerns and recommendations with the patient but he is still adamant that he would like to go home.  Patient is discharged at this time.     ____________________________________________  FINAL CLINICAL IMPRESSION(S) / ED DIAGNOSES  Final diagnoses:  Atypical chest pain      NEW MEDICATIONS STARTED DURING THIS VISIT:  ED Discharge Orders     None           This chart was dictated using voice recognition software/Dragon. Despite best efforts to proofread, errors can occur which can change the meaning. Any change was purely unintentional.    Lanette Hampshire 05/10/21 2009    Willy Eddy, MD 05/10/21 2017

## 2021-05-11 ENCOUNTER — Other Ambulatory Visit (HOSPITAL_COMMUNITY): Payer: Self-pay | Admitting: Student

## 2021-05-11 ENCOUNTER — Other Ambulatory Visit
Admission: RE | Admit: 2021-05-11 | Discharge: 2021-05-11 | Disposition: A | Discharge status: Home / Self Care | Payer: Medicare HMO | Source: Ambulatory Visit | Attending: Internal Medicine | Admitting: Internal Medicine

## 2021-05-11 DIAGNOSIS — Z01818 Encounter for other preprocedural examination: Secondary | ICD-10-CM | POA: Diagnosis present

## 2021-05-11 DIAGNOSIS — I208 Other forms of angina pectoris: Secondary | ICD-10-CM | POA: Diagnosis present

## 2021-05-11 DIAGNOSIS — I2510 Atherosclerotic heart disease of native coronary artery without angina pectoris: Secondary | ICD-10-CM | POA: Insufficient documentation

## 2021-05-11 DIAGNOSIS — R9439 Abnormal result of other cardiovascular function study: Secondary | ICD-10-CM

## 2021-05-11 LAB — BRAIN NATRIURETIC PEPTIDE: B Natriuretic Peptide: 39.6 pg/mL (ref 0.0–100.0)

## 2021-05-26 ENCOUNTER — Telehealth (HOSPITAL_COMMUNITY): Payer: Self-pay | Admitting: Emergency Medicine

## 2021-05-26 NOTE — Telephone Encounter (Signed)
Phone call to Mr. Andree Coss to discuss the plan to assess his heart. I explained that because he has 3 stents in his heart, that a cardiac CTA will not be the best test due to metal artifact caused by the stents. I told him that hes already scheduled for cardiac cath with [facility] next thurs 7/21 and that's the best test to assess the heart after his abnormal NM stress test.   Pt verbalized understanding that I was cancelling the CTA appt for this thur at [facility] 7/14. He said he wanted to call [place] to discuss together. Pt appreciated the call.  Rockwell Alexandria RN Navigator Cardiac Imaging [facility] [phone] Office  [phone] Cell

## 2021-05-28 ENCOUNTER — Ambulatory Visit: Admission: RE | Admit: 2021-05-28 | Payer: Medicare HMO | Source: Ambulatory Visit

## 2021-06-04 ENCOUNTER — Ambulatory Visit
Admission: RE | Admit: 2021-06-04 | Discharge: 2021-06-04 | Disposition: A | Discharge status: Home / Self Care | Payer: Medicare HMO | Attending: Internal Medicine | Admitting: Internal Medicine

## 2021-06-04 ENCOUNTER — Encounter: Payer: Self-pay | Admitting: Internal Medicine

## 2021-06-04 ENCOUNTER — Encounter
Admission: RE | Disposition: A | Discharge status: Home / Self Care | Payer: Self-pay | Source: Home / Self Care | Attending: Internal Medicine

## 2021-06-04 ENCOUNTER — Other Ambulatory Visit: Payer: Self-pay

## 2021-06-04 DIAGNOSIS — R9430 Abnormal result of cardiovascular function study, unspecified: Secondary | ICD-10-CM | POA: Diagnosis not present

## 2021-06-04 DIAGNOSIS — Z8669 Personal history of other diseases of the nervous system and sense organs: Secondary | ICD-10-CM | POA: Insufficient documentation

## 2021-06-04 DIAGNOSIS — I739 Peripheral vascular disease, unspecified: Secondary | ICD-10-CM | POA: Insufficient documentation

## 2021-06-04 DIAGNOSIS — I25118 Atherosclerotic heart disease of native coronary artery with other forms of angina pectoris: Secondary | ICD-10-CM | POA: Insufficient documentation

## 2021-06-04 DIAGNOSIS — Z79899 Other long term (current) drug therapy: Secondary | ICD-10-CM | POA: Diagnosis not present

## 2021-06-04 DIAGNOSIS — Z955 Presence of coronary angioplasty implant and graft: Secondary | ICD-10-CM | POA: Insufficient documentation

## 2021-06-04 DIAGNOSIS — F17200 Nicotine dependence, unspecified, uncomplicated: Secondary | ICD-10-CM | POA: Diagnosis not present

## 2021-06-04 DIAGNOSIS — R079 Chest pain, unspecified: Secondary | ICD-10-CM

## 2021-06-04 DIAGNOSIS — E782 Mixed hyperlipidemia: Secondary | ICD-10-CM | POA: Insufficient documentation

## 2021-06-04 DIAGNOSIS — I208 Other forms of angina pectoris: Secondary | ICD-10-CM | POA: Diagnosis present

## 2021-06-04 HISTORY — PX: LEFT HEART CATH AND CORONARY ANGIOGRAPHY: CATH118249

## 2021-06-04 HISTORY — DX: Hyperlipidemia, unspecified: E78.5

## 2021-06-04 SURGERY — LEFT HEART CATH AND CORONARY ANGIOGRAPHY
Anesthesia: Moderate Sedation

## 2021-06-04 MED ORDER — ONDANSETRON HCL 4 MG/2ML IJ SOLN: 4.0000 mg | Freq: Four times a day (QID) | INTRAMUSCULAR | Status: DC | PRN

## 2021-06-04 MED ORDER — LIDOCAINE HCL (PF) 1 % IJ SOLN
INTRAMUSCULAR | Status: DC | PRN
  Administered 2021-06-04: 2 mL

## 2021-06-04 MED ORDER — HEPARIN SODIUM (PORCINE) 1000 UNIT/ML IJ SOLN
INTRAMUSCULAR | Status: DC | PRN
  Administered 2021-06-04: 4500 [IU] via INTRAVENOUS

## 2021-06-04 MED ORDER — SODIUM CHLORIDE 0.9 % IV SOLN: 250.0000 mL | INTRAVENOUS | Status: DC | PRN

## 2021-06-04 MED ORDER — HYDRALAZINE HCL 20 MG/ML IJ SOLN: 10.0000 mg | INTRAMUSCULAR | Status: DC | PRN

## 2021-06-04 MED ORDER — IOHEXOL 300 MG/ML  SOLN
INTRAMUSCULAR | Status: DC | PRN
  Administered 2021-06-04: 76 mL

## 2021-06-04 MED ORDER — MIDAZOLAM HCL 2 MG/2ML IJ SOLN
INTRAMUSCULAR | Status: AC
  Filled 2021-06-04: qty 2

## 2021-06-04 MED ORDER — LABETALOL HCL 5 MG/ML IV SOLN: 10.0000 mg | INTRAVENOUS | Status: DC | PRN

## 2021-06-04 MED ORDER — SODIUM CHLORIDE 0.9% FLUSH: 3.0000 mL | INTRAVENOUS | Status: DC | PRN

## 2021-06-04 MED ORDER — HEPARIN (PORCINE) IN NACL 1000-0.9 UT/500ML-% IV SOLN
INTRAVENOUS | Status: AC
  Filled 2021-06-04: qty 1000

## 2021-06-04 MED ORDER — SODIUM CHLORIDE 0.9% FLUSH: 3.0000 mL | Freq: Two times a day (BID) | INTRAVENOUS | Status: DC

## 2021-06-04 MED ORDER — ACETAMINOPHEN 325 MG PO TABS: 650.0000 mg | ORAL_TABLET | ORAL | Status: DC | PRN

## 2021-06-04 MED ORDER — SODIUM CHLORIDE 0.9 % WEIGHT BASED INFUSION: 1.0000 mL/kg/h | INTRAVENOUS | Status: DC

## 2021-06-04 MED ORDER — HEPARIN SODIUM (PORCINE) 1000 UNIT/ML IJ SOLN
INTRAMUSCULAR | Status: AC
  Filled 2021-06-04: qty 1

## 2021-06-04 MED ORDER — VERAPAMIL HCL 2.5 MG/ML IV SOLN
INTRAVENOUS | Status: AC
  Filled 2021-06-04: qty 2

## 2021-06-04 MED ORDER — MIDAZOLAM HCL 2 MG/2ML IJ SOLN
INTRAMUSCULAR | Status: DC | PRN
  Administered 2021-06-04: 1 mg via INTRAVENOUS

## 2021-06-04 MED ORDER — SODIUM CHLORIDE 0.9 % WEIGHT BASED INFUSION
3.0000 mL/kg/h | INTRAVENOUS | Status: AC
  Administered 2021-06-04: 3 mL/kg/h via INTRAVENOUS

## 2021-06-04 MED ORDER — HEPARIN (PORCINE) IN NACL 1000-0.9 UT/500ML-% IV SOLN
INTRAVENOUS | Status: DC | PRN
  Administered 2021-06-04: 1000 mL

## 2021-06-04 MED ORDER — FENTANYL CITRATE (PF) 100 MCG/2ML IJ SOLN
INTRAMUSCULAR | Status: AC
  Filled 2021-06-04: qty 2

## 2021-06-04 MED ORDER — FENTANYL CITRATE (PF) 100 MCG/2ML IJ SOLN
INTRAMUSCULAR | Status: DC | PRN
  Administered 2021-06-04: 25 ug via INTRAVENOUS

## 2021-06-04 MED ORDER — ASPIRIN 81 MG PO CHEW: 81.0000 mg | CHEWABLE_TABLET | ORAL | Status: DC

## 2021-06-04 MED ORDER — VERAPAMIL HCL 2.5 MG/ML IV SOLN
INTRAVENOUS | Status: DC | PRN
  Administered 2021-06-04: 2.5 mg via INTRA_ARTERIAL

## 2021-06-04 MED ORDER — LIDOCAINE HCL (PF) 1 % IJ SOLN
INTRAMUSCULAR | Status: AC
  Filled 2021-06-04: qty 30

## 2021-06-04 SURGICAL SUPPLY — 10 items
CATH INFINITI 5 FR JL3.5 (CATHETERS) ×2 IMPLANT
CATH INFINITI JR4 5F (CATHETERS) ×2 IMPLANT
DEVICE RAD COMP TR BAND LRG (VASCULAR PRODUCTS) ×2 IMPLANT
GLIDESHEATH SLEND SS 6F .021 (SHEATH) ×2 IMPLANT
GUIDEWIRE INQWIRE 1.5J.035X260 (WIRE) ×1 IMPLANT
INQWIRE 1.5J .035X260CM (WIRE) ×2
PACK CARDIAC CATH (CUSTOM PROCEDURE TRAY) ×2 IMPLANT
PROTECTION STATION PRESSURIZED (MISCELLANEOUS) ×2
SET ATX SIMPLICITY (MISCELLANEOUS) ×2 IMPLANT
STATION PROTECTION PRESSURIZED (MISCELLANEOUS) ×1 IMPLANT

## 2021-06-09 ENCOUNTER — Encounter (INDEPENDENT_AMBULATORY_CARE_PROVIDER_SITE_OTHER): Payer: Self-pay | Admitting: Vascular Surgery

## 2021-06-09 ENCOUNTER — Other Ambulatory Visit: Payer: Self-pay

## 2021-06-09 ENCOUNTER — Ambulatory Visit (INDEPENDENT_AMBULATORY_CARE_PROVIDER_SITE_OTHER): Payer: Medicare HMO | Admitting: Vascular Surgery

## 2021-06-09 DIAGNOSIS — E785 Hyperlipidemia, unspecified: Secondary | ICD-10-CM

## 2021-06-09 DIAGNOSIS — I700 Atherosclerosis of aorta: Secondary | ICD-10-CM | POA: Insufficient documentation

## 2021-06-09 DIAGNOSIS — I2699 Other pulmonary embolism without acute cor pulmonale: Secondary | ICD-10-CM

## 2021-06-09 NOTE — Assessment & Plan Note (Signed)
The patient has a previous history of pulmonary embolus and had appropriate anticoagulation therapy for 6 months.  This was several years ago with no recurrent symptoms.

## 2021-06-09 NOTE — Assessment & Plan Note (Signed)
The patient has undergone a recent CT angiogram of the chest abdomen pelvis from last month which I have independently reviewed.  This does demonstrate some plaque and mural thrombus in the distal descending thoracic aorta as well as some plaque in the aorta and the abdominal portion.  There was some mild stenosis in the left iliac vessels that did not appear to be greater than 30 to 40%.  He is not that symptomatic at this point he is on appropriate medical therapy with Plavix and a statin agent.  We discussed the pathophysiology and natural history of peripheral arterial disease today.  We discussed the medications to try to prevent progression.  At this point, I will just plan an annual follow-up with aortoiliac duplex and ABIs unless his symptoms worsen in the interim.

## 2021-06-09 NOTE — Progress Notes (Signed)
Patient ID: Gary Andersen, male   DOB: 1946/11/04, 75 y.o.   MRN: [id number]  Chief Complaint  Patient presents with   New Patient (Initial Visit)    Ref [facility]    HPI Gary Andersen is a 75 y.o. male.  I am asked to see the patient by Dr. Juliann Pares for evaluation of aortoiliac atherosclerosis/peripheral arterial disease.  The patient has undergone a recent CT angiogram of the chest abdomen pelvis from last month which I have independently reviewed.  This does demonstrate some plaque and mural thrombus in the distal descending thoracic aorta as well as some plaque in the aorta and the abdominal portion.  There was some mild stenosis in the left iliac vessels that did not appear to be greater than 30 to 40%.  Patient reports being very active and walking regularly.  He does have a previous history of pulmonary embolus over 5 years ago.  He was on anticoagulation for a time, but after he completed 6 months he came off of this.  He remains on Plavix and a statin agent secondary to significant coronary disease and previous coronary interventions.     Past Medical History:  Diagnosis Date   Hyperlipidemia    Myocardial infarction (HCC) 08/2014   Pulmonary embolism (HCC) 2013    Past Surgical History:  Procedure Laterality Date   CARDIAC CATHETERIZATION  08/2014   x3 between 08/2014 and 11/2014.  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   LEFT HEART CATH AND  CORONARY ANGIOGRAPHY N/A 06/04/2021   Procedure: LEFT HEART CATH AND CORONARY ANGIOGRAPHY Radial Approach;  Surgeon: Alwyn Pea, MD;  Location: [facility];  Service: Cardiovascular;  Laterality: N/A;   LEG SURGERY Left      Family History No bleeding disorders, clotting disorders, autoimmune diseases, or aneurysms   Social History   Tobacco Use   Smoking status: Every Day    Packs/day: 1.00    Years: 63.00    Pack years: 63.00    Types: Cigarettes   Smokeless tobacco: Never   Tobacco comments:    started age 74  Vaping Use   Vaping Use: Never used  Substance Use Topics   Alcohol use: Not Currently   Drug use: Never     No Known Allergies  Current Outpatient Medications  Medication Sig Dispense Refill   atorvastatin (LIPITOR) 40 MG tablet Take 40 mg by mouth in the morning.     clopidogrel (PLAVIX) 75 MG tablet Take 75 mg by mouth in the morning.     finasteride (PROSCAR) 5 MG tablet Take 5 mg by mouth in the morning.     MITIGARE 0.6 MG CAPS Take 0.6 mg by mouth daily as needed (gout flare).     Vitamin D3 (VITAMIN D) 25 MCG tablet Take 1,000 Units by mouth in the morning.     tetrahydrozoline 0.05 % ophthalmic solution Place 1-2 drops into both eyes 3 (three) times daily as needed (eye irritation/dry  eyes). (Patient not taking: Reported on 06/09/2021)     No current facility-administered medications for this visit.      REVIEW OF SYSTEMS (Negative unless checked)  Constitutional: [] Weight loss  [] Fever  [] Chills Cardiac: [] Chest pain   [] Chest pressure   [] Palpitations   [] Shortness of breath when laying flat   [] Shortness of breath at rest   [] Shortness of breath with exertion. Vascular:  [] Pain in legs with walking   [] Pain in legs at rest   [] Pain in legs when laying flat   [] Claudication   [] Pain in feet when walking  [] Pain in feet at rest  [] Pain in feet when laying flat   [] History of DVT   [x] Phlebitis   [] Swelling in legs   [] Varicose veins    [] Non-healing ulcers Pulmonary:   [] Uses home oxygen   [] Productive cough   [] Hemoptysis   [] Wheeze  [] COPD   [] Asthma Neurologic:  [] Dizziness  [] Blackouts   [] Seizures   [] History of stroke   [] History of TIA  [] Aphasia   [] Temporary blindness   [] Dysphagia   [] Weakness or numbness in arms   [] Weakness or numbness in legs Musculoskeletal:  [] Arthritis   [] Joint swelling   [] Joint pain   [] Low back pain Hematologic:  [] Easy bruising  [] Easy bleeding   [] Hypercoagulable state   [] Anemic  [] Hepatitis Gastrointestinal:  [] Blood in stool   [] Vomiting blood  [] Gastroesophageal reflux/heartburn   [] Abdominal pain Genitourinary:  [] Chronic kidney disease   [] Difficult urination  [] Frequent urination  [] Burning with urination   [] Hematuria Skin:  [] Rashes   [] Ulcers   [] Wounds Psychological:  [] History of anxiety   []  History of major depression.    Physical Exam BP 119/76 (BP Location: Right Arm)   Pulse 85   Resp 16   Ht 5' 11.75" (1.822 m)   Wt 192 lb 12.8 oz (87.5 kg)   BMI 26.33 kg/m  Gen:  WD/WN, NAD Head: [place]/AT, No temporalis wasting.  Ear/Nose/Throat: Hearing grossly intact, nares w/o erythema or drainage, oropharynx w/o Erythema/Exudate Eyes: Conjunctiva clear, sclera non-icteric  Neck: trachea midline.  No JVD.  Pulmonary:  Good air movement, respirations not labored, no use of accessory muscles  Cardiac: RRR, no JVD Vascular:  Vessel Right Left  Radial Palpable Palpable                          PT 2+ 2+  DP 2+ 2+   Gastrointestinal:. No masses, surgical incisions, or scars. Musculoskeletal: M/S 5/5 throughout.  Extremities without ischemic changes.  No deformity or atrophy. No edema. Neurologic: Sensation grossly intact in extremities.  Symmetrical.  Speech is fluent. Motor exam as listed above. Psychiatric: Judgment intact, Mood & affect appropriate for pt's clinical situation. Dermatologic: No rashes or ulcers noted.  No cellulitis or open wounds.    Radiology DG  Chest 2 View  Result Date: 05/10/2021 CLINICAL DATA:  Burning pain to the mid and upper back. EXAM: CHEST - 2 VIEW COMPARISON:  None. FINDINGS: The lungs are hyperinflated. A calcified lung nodule is seen within the lateral aspect of the mid to upper right lung. There is no evidence of acute infiltrate, pleural effusion or pneumothorax. The heart size and mediastinal contours are within normal limits. The visualized skeletal structures are unremarkable. IMPRESSION: No active cardiopulmonary disease. Electronically Signed   By: M.D.   On: 05/10/2021 15:40   CARDIAC CATHETERIZATION  Result Date: 06/04/2021 Formatting of this result is different  from the original.   Prox RCA lesion is 50% stenosed.   Previously placed Prox LAD stent (unknown type) is  widely patent.   There is mild left ventricular systolic dysfunction.   LV end diastolic pressure is normal.   The left ventricular ejection fraction is 45-50% by visual estimate. Conclusion Mildly depressed overall left ventricular function Mild inferior hypokinesis EF between 45 to 50% Coronaries Left main minor irregularities relatively free of disease large LAD large with a proximal patent stent minor irregularities Circumflex medium in size no significant disease RCA large with very long stent proximal to distal only minor irregularities 50% mid in-stent restenosis nonobstructive Intervention deferred not indicated TR band applied No complications   CT Angio Chest/Abd/Pel for Dissection W and/or Wo Contrast  Result Date: 05/10/2021 CLINICAL DATA:  Chest pain, aortic dissection suspected EXAM: CT ANGIOGRAPHY CHEST, ABDOMEN AND PELVIS TECHNIQUE: Non-contrast CT of the chest was initially obtained. Multidetector CT imaging through the chest, abdomen and pelvis was performed using the standard protocol during bolus administration of intravenous contrast. Multiplanar reconstructed images and MIPs were obtained and reviewed to evaluate the  vascular anatomy. CONTRAST:  OMNIPAQUE IOHEXOL 350 MG/ML SOLN COMPARISON:  None. FINDINGS: CTA CHEST FINDINGS Cardiovascular: Preferential opacification of the thoracic aorta. Normal contour and caliber of the thoracic aorta. No evidence of aneurysm, dissection, or other acute aortic pathology. Moderate mixed calcific atherosclerosis including focal irregular mural thrombus of the posterior aspect of the lower lobes descending thoracic aorta (series 5, image 67). No pulmonary embolism on non tailored examination. Normal heart size. Left and right coronary artery stent. No pericardial effusion. Mediastinum/Nodes: No enlarged mediastinal, hilar, or axillary lymph nodes. Small hiatal hernia. Calcified pretracheal and right hilar lymph nodes. Small hiatal hernia. Thyroid gland, trachea, and esophagus demonstrate no significant findings. Lungs/Pleura: Moderate centrilobular emphysema. Benign calcified nodule of the right upper lobe (series 6, image 54). No pleural effusion or pneumothorax. Musculoskeletal: No chest wall abnormality. No acute or significant osseous findings. Review of the MIP images confirms the above findings. CTA ABDOMEN AND PELVIS FINDINGS VASCULAR Normal caliber of the abdominal aorta with moderate to severe mixed calcific atherosclerosis. No evidence of aneurysm, dissection, or other acute aortic pathology. There is standard branching pattern of the abdominal aorta, with solitary bilateral renal arteries. There is mixed atherosclerosis of the branch vessel origins without high-grade stenosis. Review of the MIP images confirms the above findings. NON-VASCULAR Hepatobiliary: No solid liver abnormality is seen. No gallstones, gallbladder wall thickening, or biliary dilatation. Pancreas: Unremarkable. No pancreatic ductal dilatation or surrounding inflammatory changes. Spleen: Normal in size without significant abnormality. Adrenals/Urinary Tract: Adrenal glands are unremarkable. Kidneys are  normal, without renal calculi, solid lesion, or hydronephrosis. Small dependent bladder calculi. Small bladder diverticula. Stomach/Bowel: Stomach is within normal limits. Appendix appears normal. No evidence of bowel wall thickening, distention, or inflammatory changes. Sigmoid diverticulosis. Lymphatic: No enlarged abdominal or pelvic lymph nodes. Reproductive: Mild prostatomegaly. Other: No abdominal wall hernia or abnormality. No abdominopelvic ascites. Musculoskeletal: No acute or significant osseous findings. Review of the MIP images confirms the above findings. IMPRESSION: 1. No evidence of aortic aneurysm, dissection, or other acute aortic pathology. 2. 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 3. Coronary artery disease. 4. Emphysema. 5. Small hiatal hernia. 6. Small dependent bladder calculi. No renal calculi, ureteral calculi, or hydronephrosis. Aortic Atherosclerosis (ICD10-I70.0) and Emphysema (ICD10-J43.9). Electronically Signed   By: Trinna Post  Jayme CloudBibbey M.D.   On: 05/10/2021 18:20    Labs Recent Results (from the past 2160 hour(s))  Basic metabolic panel     Status: Abnormal   Collection Time: 05/10/21  2:53 PM  Result Value Ref Range   Sodium 137 135 - 145 mmol/L   Potassium 4.2 3.5 - 5.1 mmol/L   Chloride 105 98 - 111 mmol/L   CO2 26 22 - 32 mmol/L   Glucose, Bld 190 (H) 70 - 99 mg/dL    Comment: Glucose reference range applies only to samples taken after fasting for at least 8 hours.   BUN 18 8 - 23 mg/dL   Creatinine, Ser 1.611.29 (H) 0.61 - 1.24 mg/dL   Calcium 9.2 8.9 - 09.610.3 mg/dL   GFR, Estimated 57 (L) >60 mL/min    Comment: (NOTE) Calculated using the CKD-EPI Creatinine Equation (2021)    Anion gap 6 5 - 15    Comment: Performed at [facility], [address] [place], [place] [address]  CBC     Status: None   Collection Time: 05/10/21  2:53 PM   Result Value Ref Range   WBC 6.5 4.0 - 10.5 K/uL   RBC 5.02 4.22 - 5.81 MIL/uL   Hemoglobin 15.4 13.0 - 17.0 g/dL   HCT 98.144.7 19.139.0 - 47.852.0 %   MCV 89.0 80.0 - 100.0 fL   MCH 30.7 26.0 - 34.0 pg   MCHC 34.5 30.0 - 36.0 g/dL   RDW 29.512.4 62.111.5 - 30.815.5 %   Platelets 209 150 - 400 K/uL   nRBC 0.0 0.0 - 0.2 %    Comment: Performed at [facility], [address] [place], [place] [address]  Troponin I (High Sensitivity)     Status: None   Collection Time: 05/10/21  2:53 PM  Result Value Ref Range   Troponin I (High Sensitivity) 5 <18 ng/L    Comment: (NOTE) Elevated high sensitivity troponin I (hsTnI) values and significant  changes across serial measurements may suggest ACS but many other  chronic and acute conditions are known to elevate hsTnI results.  Refer to the "Links" section for chest pain algorithms and additional  guidance. Performed at [facility], [address] [place], [place] [address]   Troponin I (High Sensitivity)     Status: None   Collection Time: 05/10/21  5:23 PM  Result Value Ref Range   Troponin I (High Sensitivity) 4 <18 ng/L    Comment: (NOTE) Elevated high sensitivity troponin I (hsTnI) values and significant  changes across serial measurements may suggest ACS but many other  chronic and acute conditions are known to elevate hsTnI results.  Refer to the "Links" section for chest pain algorithms and additional  guidance. Performed at [facility], [address] [place], [place] [address]   Brain natriuretic peptide     Status: None   Collection Time: 05/11/21 12:15 PM  Result Value Ref Range   B Natriuretic Peptide 39.6 0.0 - 100.0 pg/mL    Comment: Performed at [facility], [address] [place], [place] [address]    Assessment/Plan:  Aortic atherosclerosis (HCC) The patient has undergone a recent CT angiogram of the chest abdomen pelvis from last month which I have independently reviewed.  This does  demonstrate some plaque and mural thrombus in the distal descending thoracic aorta as well as some plaque in the aorta and the abdominal portion.  There was some mild stenosis in the left iliac vessels that  did not appear to be greater than 30 to 40%.  He is not that symptomatic at this point he is on appropriate medical therapy with Plavix and a statin agent.  We discussed the pathophysiology and natural history of peripheral arterial disease today.  We discussed the medications to try to prevent progression.  At this point, I will just plan an annual follow-up with aortoiliac duplex and ABIs unless his symptoms worsen in the interim.  Pulmonary embolism (HCC) The patient has a previous history of pulmonary embolus and had appropriate anticoagulation therapy for 6 months.  This was several years ago with no recurrent symptoms.  Hyperlipidemia lipid control important in reducing the progression of atherosclerotic disease. Continue statin therapy      Festus Barren 06/09/2021, 12:59 PM   This note was created with Dragon medical transcription system.  Any errors from dictation are unintentional.

## 2021-06-09 NOTE — Assessment & Plan Note (Signed)
lipid control important in reducing the progression of atherosclerotic disease. Continue statin therapy  

## 2022-06-08 ENCOUNTER — Encounter (INDEPENDENT_AMBULATORY_CARE_PROVIDER_SITE_OTHER): Payer: Medicare HMO

## 2022-06-08 ENCOUNTER — Ambulatory Visit (INDEPENDENT_AMBULATORY_CARE_PROVIDER_SITE_OTHER): Payer: Medicare HMO | Admitting: Vascular Surgery

## 2022-08-14 IMAGING — CT CT ANGIO CHEST-ABD-PELV FOR DISSECTION W/ AND WO/W CM
2 of 7 series · 13 of 46 positions shown, 15 images · non-contrast
Comparison: None.

CLINICAL DATA: Chest pain, aortic dissection suspected

EXAM:
CT ANGIOGRAPHY CHEST, ABDOMEN AND PELVIS
TECHNIQUE: Non-contrast CT of the chest was initially obtained.

[Series 5: axial arterial · axial · arterial · 0.88mm/px · z∈[-1007,-428]mm · 10 of 223 slices shown, 12 images]
[im 15/223  soft-tissue]
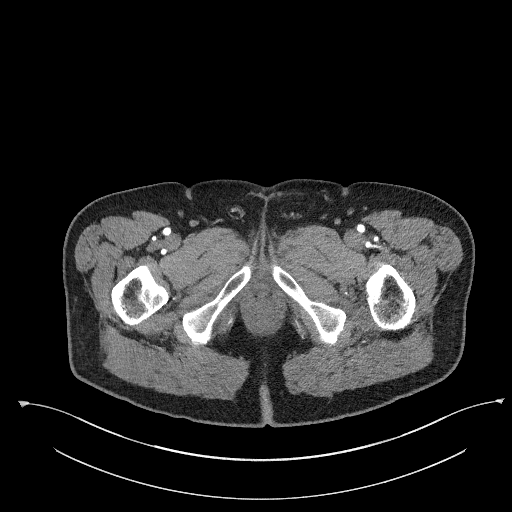
[im 15/223  bone]
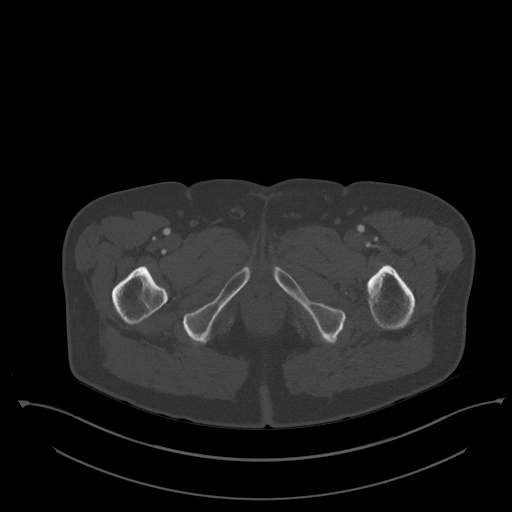
[im 45/223  soft-tissue]
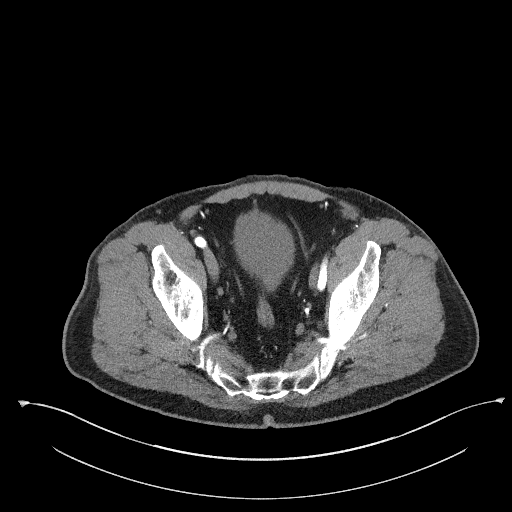
[im 60/223  soft-tissue]
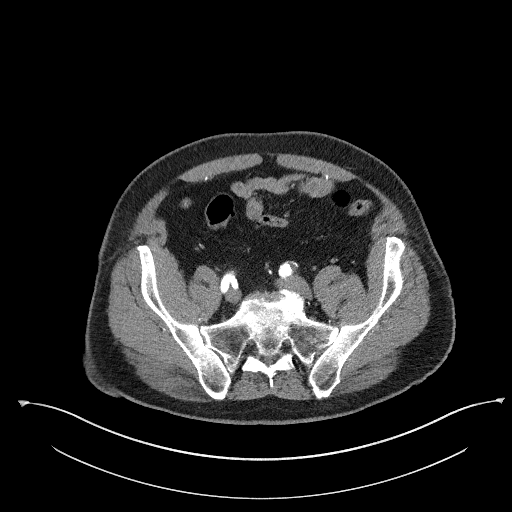
[im 75/223  soft-tissue]
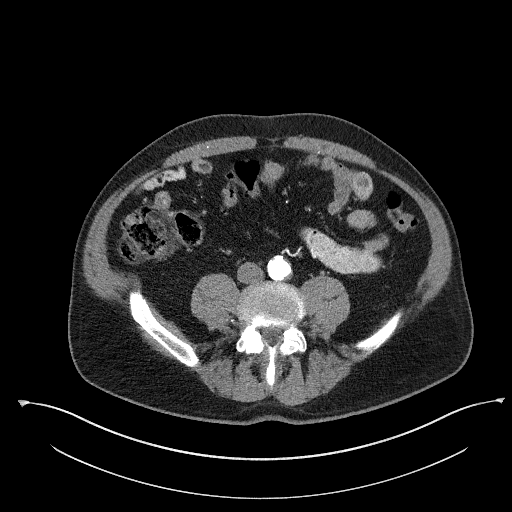
[im 104/223  soft-tissue]
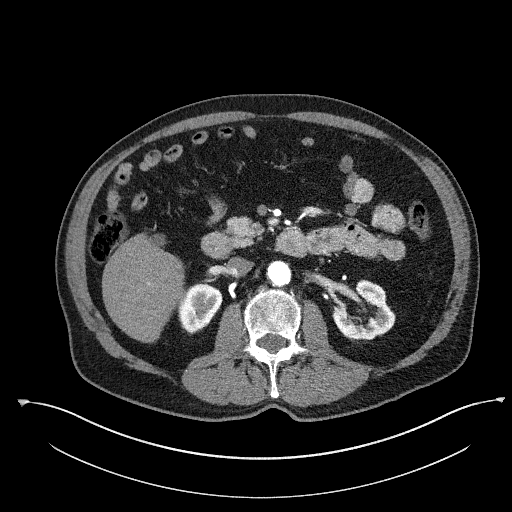
[im 119/223  soft-tissue]
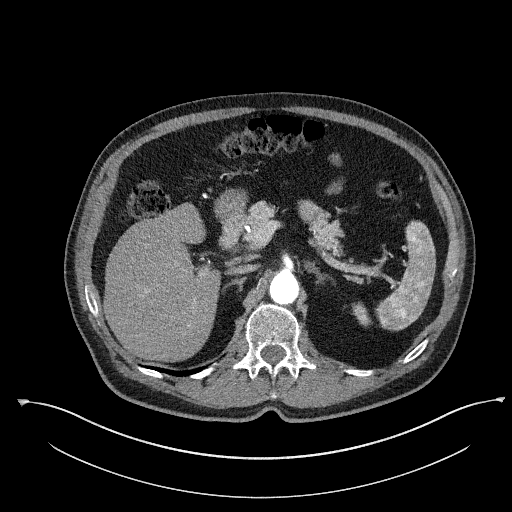
[im 149/223  soft-tissue]
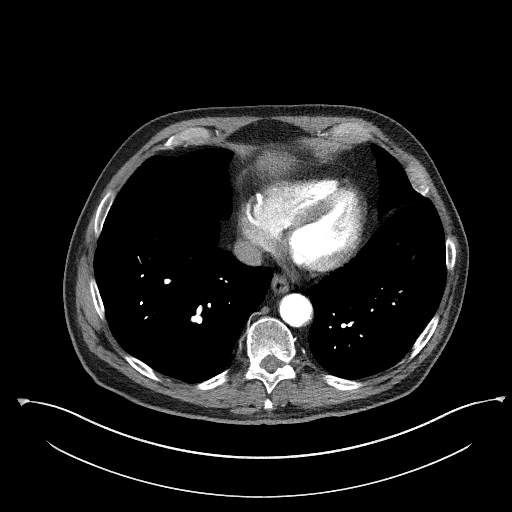
[im 163/223  soft-tissue]
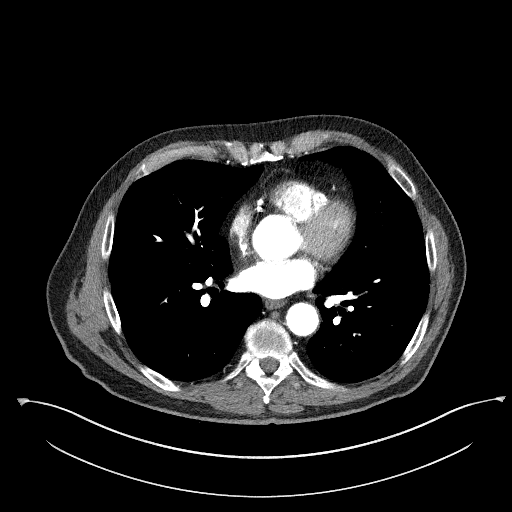
[im 178/223  soft-tissue]
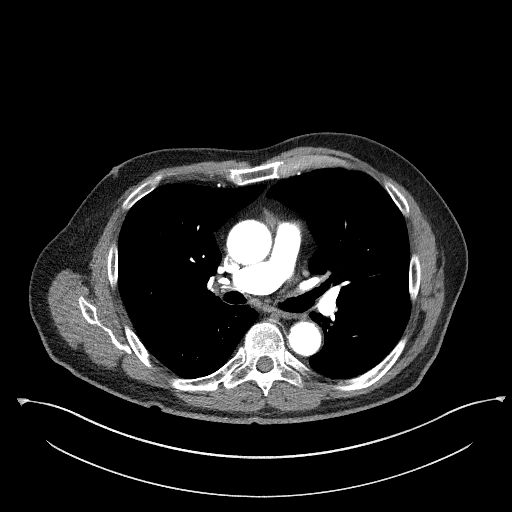
[im 178/223  bone]
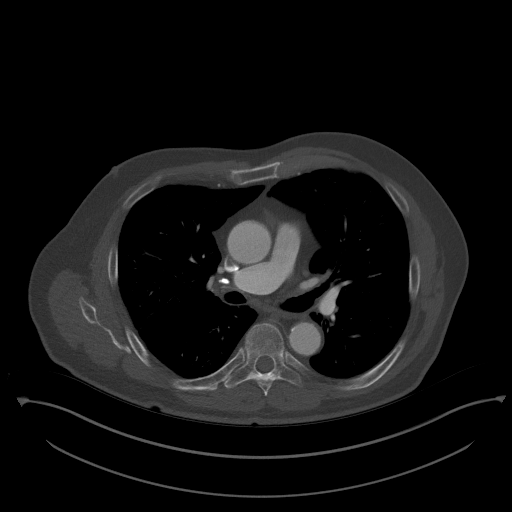
[im 208/223  soft-tissue]
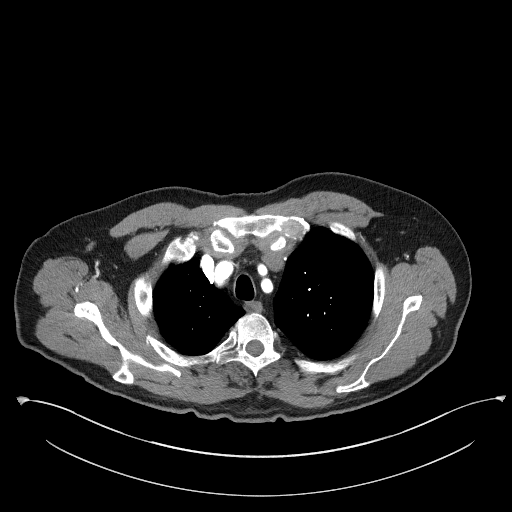

[Series 7: coronals · coronal · 0.80mm/px · 3 of 148 slices shown]
[im 37/148  soft-tissue]
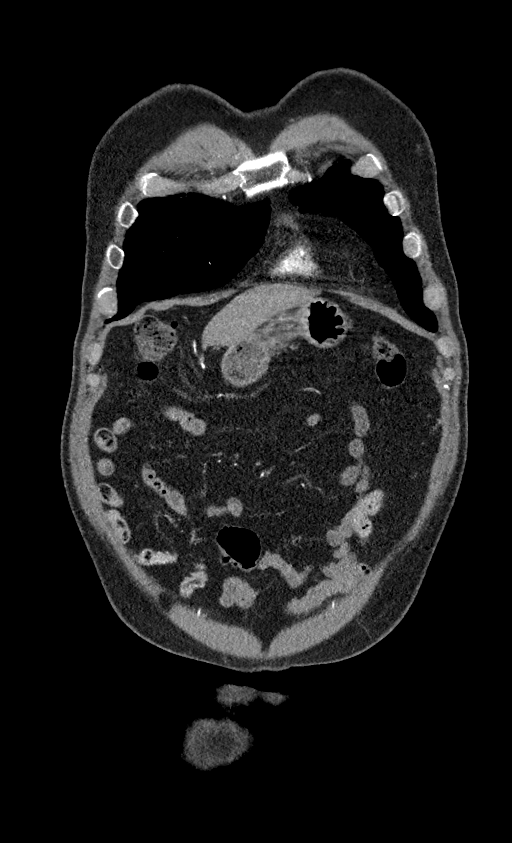
[im 74/148  soft-tissue]
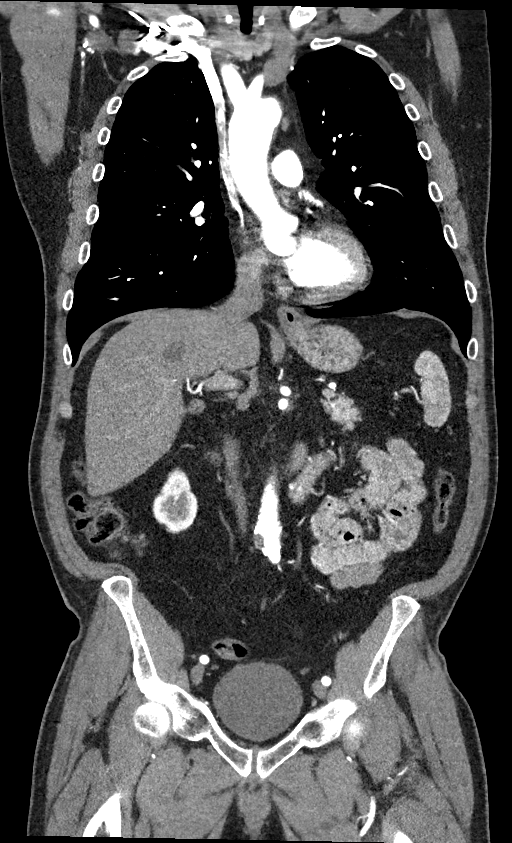
[im 111/148  soft-tissue]
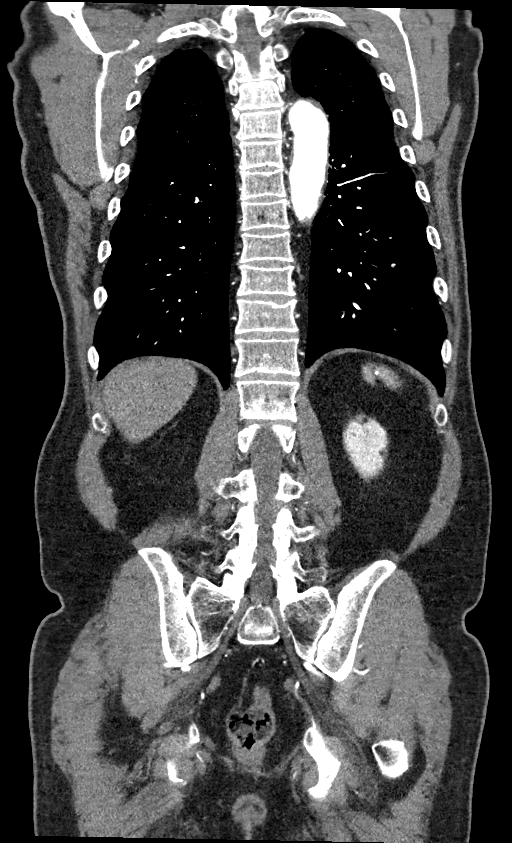

[13 of 46 positions shown; findings below may reference images not displayed]

Multidetector CT imaging through the chest, abdomen and pelvis was
performed using the standard protocol during bolus administration of
intravenous contrast. Multiplanar reconstructed images and MIPs were
obtained and reviewed to evaluate the vascular anatomy.

CONTRAST:  100mL OMNIPAQUE IOHEXOL 350 MG/ML SOLN
FINDINGS: CTA CHEST FINDINGS

Cardiovascular: Preferential opacification of the thoracic aorta.
Normal contour and caliber of the thoracic aorta. No evidence of
aneurysm, dissection, or other acute aortic pathology. Moderate
mixed calcific atherosclerosis including focal irregular mural
thrombus of the posterior aspect of the lower lobes descending
thoracic aorta (series 5, image 67). No pulmonary embolism on non
tailored examination. Normal heart size. Left and right coronary
artery stent. No pericardial effusion.

Mediastinum/Nodes: No enlarged mediastinal, hilar, or axillary lymph
nodes. Small hiatal hernia. Calcified pretracheal and right hilar
lymph nodes. Small hiatal hernia. Thyroid gland, trachea, and
esophagus demonstrate no significant findings.

Lungs/Pleura: Moderate centrilobular emphysema. Benign calcified
nodule of the right upper lobe (series 6, image 54). No pleural
effusion or pneumothorax.

Musculoskeletal: No chest wall abnormality. No acute or significant
osseous findings.

Review of the MIP images confirms the above findings.

CTA ABDOMEN AND PELVIS FINDINGS

VASCULAR

Normal caliber of the abdominal aorta with moderate to severe mixed
calcific atherosclerosis. No evidence of aneurysm, dissection, or
other acute aortic pathology. There is standard branching pattern of
the abdominal aorta, with solitary bilateral renal arteries. There
is mixed atherosclerosis of the branch vessel origins without
high-grade stenosis.

Review of the MIP images confirms the above findings.

NON-VASCULAR

Hepatobiliary: No solid liver abnormality is seen. No gallstones,
gallbladder wall thickening, or biliary dilatation.

Pancreas: Unremarkable. No pancreatic ductal dilatation or
surrounding inflammatory changes.

Spleen: Normal in size without significant abnormality.

Adrenals/Urinary Tract: Adrenal glands are unremarkable. Kidneys are
normal, without renal calculi, solid lesion, or hydronephrosis.
Small dependent bladder calculi. Small bladder diverticula.

Stomach/Bowel: Stomach is within normal limits. Appendix appears
normal. No evidence of bowel wall thickening, distention, or
inflammatory changes. Sigmoid diverticulosis.

Lymphatic: No enlarged abdominal or pelvic lymph nodes.

Reproductive: Mild prostatomegaly.

Other: No abdominal wall hernia or abnormality. No abdominopelvic
ascites.

Musculoskeletal: No acute or significant osseous findings.

Review of the MIP images confirms the above findings.
IMPRESSION: 1. No evidence of aortic aneurysm, dissection, or other acute aortic
pathology.
2. Moderate to severe mixed calcific aortic atherosclerosis
including focal irregular mural thrombus of the posterior aspect of
the lower descending thoracic aorta.
3. Coronary artery disease.
4. Emphysema.
5. Small hiatal hernia.
6. Small dependent bladder calculi. No renal calculi, ureteral
calculi, or hydronephrosis.

Aortic Atherosclerosis ([id number].C) and Emphysema ([id number].U).

## 2022-11-14 ENCOUNTER — Emergency Department: Payer: Medicare HMO

## 2022-11-14 ENCOUNTER — Emergency Department
Admission: EM | Admit: 2022-11-14 | Discharge: 2022-11-14 | Disposition: A | Discharge status: Home / Self Care | Payer: Medicare HMO | Attending: Emergency Medicine | Admitting: Emergency Medicine

## 2022-11-14 ENCOUNTER — Other Ambulatory Visit: Payer: Self-pay

## 2022-11-14 DIAGNOSIS — Z20822 Contact with and (suspected) exposure to covid-19: Secondary | ICD-10-CM | POA: Diagnosis not present

## 2022-11-14 DIAGNOSIS — R600 Localized edema: Secondary | ICD-10-CM | POA: Insufficient documentation

## 2022-11-14 DIAGNOSIS — R0602 Shortness of breath: Secondary | ICD-10-CM | POA: Diagnosis present

## 2022-11-14 DIAGNOSIS — J181 Lobar pneumonia, unspecified organism: Secondary | ICD-10-CM | POA: Insufficient documentation

## 2022-11-14 DIAGNOSIS — J189 Pneumonia, unspecified organism: Secondary | ICD-10-CM

## 2022-11-14 LAB — COMPREHENSIVE METABOLIC PANEL
ALT: 18 U/L (ref 0–44)
AST: 21 U/L (ref 15–41)
Albumin: 4 g/dL (ref 3.5–5.0)
Alkaline Phosphatase: 71 U/L (ref 38–126)
Anion gap: 8 (ref 5–15)
BUN: 19 mg/dL (ref 8–23)
CO2: 27 mmol/L (ref 22–32)
Calcium: 9.2 mg/dL (ref 8.9–10.3)
Chloride: 106 mmol/L (ref 98–111)
Creatinine, Ser: 1.5 mg/dL — ABNORMAL HIGH (ref 0.61–1.24)
GFR, Estimated: 47 mL/min — ABNORMAL LOW (ref >60)
Glucose, Bld: 113 mg/dL — ABNORMAL HIGH (ref 70–99)
Potassium: 4.6 mmol/L (ref 3.5–5.1)
Sodium: 141 mmol/L (ref 135–145)
Total Bilirubin: 1.6 mg/dL — ABNORMAL HIGH (ref 0.3–1.2)
Total Protein: 7.2 g/dL (ref 6.5–8.1)

## 2022-11-14 LAB — CBC WITH DIFFERENTIAL/PLATELET
Abs Immature Granulocytes: 0.01 10*3/uL (ref 0.00–0.07)
Basophils Absolute: 0 10*3/uL (ref 0.0–0.1)
Basophils Relative: 0 %
Eosinophils Absolute: 0.1 10*3/uL (ref 0.0–0.5)
Eosinophils Relative: 2 %
HCT: 48.2 % (ref 39.0–52.0)
Hemoglobin: 16.5 g/dL (ref 13.0–17.0)
Immature Granulocytes: 0 %
Lymphocytes Relative: 37 %
Lymphs Abs: 2.5 10*3/uL (ref 0.7–4.0)
MCH: 30.4 pg (ref 26.0–34.0)
MCHC: 34.2 g/dL (ref 30.0–36.0)
MCV: 88.8 fL (ref 80.0–100.0)
Monocytes Absolute: 0.5 10*3/uL (ref 0.1–1.0)
Monocytes Relative: 7 %
Neutro Abs: 3.5 10*3/uL (ref 1.7–7.7)
Neutrophils Relative %: 54 %
Platelets: 166 10*3/uL (ref 150–400)
RBC: 5.43 MIL/uL (ref 4.22–5.81)
RDW: 12.1 % (ref 11.5–15.5)
WBC: 6.6 10*3/uL (ref 4.0–10.5)
nRBC: 0 % (ref 0.0–0.2)

## 2022-11-14 LAB — RESP PANEL BY RT-PCR (RSV, FLU A&B, COVID)  RVPGX2
Influenza A by PCR: NEGATIVE
Influenza B by PCR: NEGATIVE
Resp Syncytial Virus by PCR: NEGATIVE
SARS Coronavirus 2 by RT PCR: NEGATIVE

## 2022-11-14 LAB — BRAIN NATRIURETIC PEPTIDE: B Natriuretic Peptide: 34.9 pg/mL (ref 0.0–100.0)

## 2022-11-14 LAB — TROPONIN I (HIGH SENSITIVITY)
Troponin I (High Sensitivity): 8 ng/L (ref <18)
Troponin I (High Sensitivity): 7 ng/L (ref <18)

## 2022-11-14 MED ORDER — CEFDINIR 300 MG PO CAPS: 300.0000 mg | ORAL_CAPSULE | Freq: Two times a day (BID) | ORAL | 0 refills | Status: AC

## 2022-11-14 MED ORDER — IOHEXOL 350 MG/ML SOLN
75.0000 mL | Freq: Once | INTRAVENOUS | Status: AC | PRN
  Administered 2022-11-14: 75 mL via INTRAVENOUS

## 2022-11-14 MED ORDER — AZITHROMYCIN 250 MG PO TABS: ORAL_TABLET | ORAL | 0 refills | Status: DC

## 2022-11-14 NOTE — ED Triage Notes (Signed)
Pt states he got sick with flu like symptoms about a week ago and now feels like he cannot breath- pt states this happened about 10 years ago and he had blood clots in his lungs- pt denies chest pain

## 2022-11-14 NOTE — Discharge Instructions (Addendum)
Your CT scan does not reveal evidence of a pulmonary embolism.  Your heart tests are normal today.  Your x-ray of your chest shows a pneumonia in your left lower lung.  Please take antibiotics as prescribed for this.  Please return for any new, worsening, or changing symptoms or other concerns.  It was a pleasure caring for you today.

## 2022-11-14 NOTE — ED Provider Notes (Signed)
[facility] Provider Note    Event Date/Time   First MD Initiated Contact with Patient 11/14/22 1416     (approximate)   History   Shortness of Breath   HPI  Gary Andersen is a 76 y.o. male with a past medical history of pulmonary embolism who presents today for evaluation of shortness of breath and dyspnea on exertion.  Patient reports that he had flulike symptoms 1.5 weeks ago which have all resolved with the exception of shortness of breath with chest with worsening over the past 3 days.  Reports that he gets winded with even short walks.  He denies chest pain.  He reports that he has bilateral lower extremity edema which has not changed.  He denies calf pain.  No fevers or chills.  Patient Active Problem List   Diagnosis Date Noted   Aortic atherosclerosis (HCC) 06/09/2021   Pulmonary embolism (HCC) 06/09/2021   Hyperlipidemia 06/09/2021          Physical Exam   Triage Vital Signs: ED Triage Vitals  Enc Vitals Group     BP 11/14/22 1418 112/72     Pulse Rate 11/14/22 1418 61     Resp 11/14/22 1418 18     Temp 11/14/22 1418 97.9 F (36.6 C)     Temp Source 11/14/22 1418 Oral     SpO2 11/14/22 1418 100 %     Weight 11/14/22 1318 193 lb (87.5 kg)     Height 11/14/22 1318 6' (1.829 m)     Head Circumference --      Peak Flow --      Pain Score 11/14/22 1318 0     Pain Loc --      Pain Edu? --      Excl. in GC? --     Most recent vital signs: Vitals:   11/14/22 1418 11/14/22 1650  BP: 112/72 112/78  Pulse: 61 66  Resp: 18 18  Temp: 97.9 F (36.6 C) 97.7 F (36.5 C)  SpO2: 100% 100%    Physical Exam Vitals and nursing note reviewed.  Constitutional:      General: Awake and alert. No acute distress.    Appearance: Normal appearance. The patient is normal weight.  HENT:     Head: Normocephalic and atraumatic.     Mouth: Mucous membranes are moist.  Eyes:     General: PERRL. Normal EOMs        Right eye: No discharge.         Left eye: No discharge.     Conjunctiva/sclera: Conjunctivae normal.  Cardiovascular:     Rate and Rhythm: Normal rate and regular rhythm.     Pulses: Normal pulses.  Pulmonary:     Effort: Pulmonary effort is normal. No respiratory distress.     Breath sounds: Left lower lobe rhonchi noted. No wheezes or crackles. Able to speak easily in complete sentences Abdominal:     Abdomen is soft. There is no abdominal tenderness. No rebound or guarding. No distention. Musculoskeletal:        General: No swelling. Normal range of motion.     Cervical back: Normal range of motion and neck supple.  No lower extremity edema or calf tenderness Skin:    General: Skin is warm and dry.     Capillary Refill: Capillary refill takes less than 2 seconds.     Findings: No rash.  Neurological:     Mental Status: The patient is awake and  alert.      ED Results / Procedures / Treatments   Labs (all labs ordered are listed, but only abnormal results are displayed) Labs Reviewed  COMPREHENSIVE METABOLIC PANEL - Abnormal; Notable for the following components:      Result Value   Glucose, Bld 113 (*)    Creatinine, Ser 1.50 (*)    Total Bilirubin 1.6 (*)    GFR, Estimated 47 (*)    All other components within normal limits  RESP PANEL BY RT-PCR (RSV, FLU A&B, COVID)  RVPGX2  CBC WITH DIFFERENTIAL/PLATELET  BRAIN NATRIURETIC PEPTIDE  TROPONIN I (HIGH SENSITIVITY)  TROPONIN I (HIGH SENSITIVITY)     EKG     RADIOLOGY I independently reviewed and interpreted imaging and agree with radiologists findings.     PROCEDURES:  Critical Care performed:   Procedures   MEDICATIONS ORDERED IN ED: Medications  iohexol (OMNIPAQUE) 350 MG/ML injection 75 mL (75 mLs Intravenous Contrast Given 11/14/22 1552)     IMPRESSION / MDM / ASSESSMENT AND PLAN / ED COURSE  I reviewed the triage vital signs and the nursing notes.   Differential diagnosis includes, but is not limited to, influenza,  COVID-19, bronchitis, pneumonia, pulmonary embolism, anemia.  Patient is awake and alert, hemodynamically stable and afebrile.  He has normal oxygen saturation 100% on room air.  Patient placed on cardiac monitor and continuous pulse oximetry.  Labs obtained in triage are overall reassuring with negative troponin x2 and normal BNP, no leukocytosis.  Chest x-ray does demonstrate evidence of pneumonia in his left lower lobe.  Patient is very concerned about having a pulmonary embolism, and given his history of pulmonary embolism and his worsening shortness of breath, this is not unreasonable.  CTA chest was obtained for further evaluation, and this is negative for pulmonary embolism.  Patient is reassured by these findings.  He was started on antibiotics for his pneumonia.  No recent hospitalizations to suggest hospital-acquired pneumonia.  Given his normal vital signs and his normal oxygenation of 100% on room air, I do not think that he requires hospitalization.  Patient feels reassured by these findings.  Discussed return precautions and the importance of close outpatient follow-up.  Patient understands and agrees with plan.  He was discharged in stable condition.   Patient's presentation is most consistent with acute complicated illness / injury requiring diagnostic workup.    FINAL CLINICAL IMPRESSION(S) / ED DIAGNOSES   Final diagnoses:  Community acquired pneumonia of left lower lobe of lung     Rx / DC Orders   ED Discharge Orders          Ordered    azithromycin (ZITHROMAX Z-PAK) 250 MG tablet        11/14/22 1643    cefdinir (OMNICEF) 300 MG capsule  2 times daily        11/14/22 1643             Note:  This document was prepared using Dragon voice recognition software and may include unintentional dictation errors.   Jackelyn Hoehn, PA-C 11/14/22 1653    Corena Herter, MD 11/14/22 1806

## 2023-09-06 ENCOUNTER — Encounter: Payer: Self-pay | Admitting: Dermatology

## 2023-09-06 ENCOUNTER — Ambulatory Visit: Payer: Medicare HMO | Admitting: Dermatology

## 2023-09-06 DIAGNOSIS — D0439 Carcinoma in situ of skin of other parts of face: Secondary | ICD-10-CM | POA: Diagnosis not present

## 2023-09-06 DIAGNOSIS — L820 Inflamed seborrheic keratosis: Secondary | ICD-10-CM | POA: Diagnosis not present

## 2023-09-06 DIAGNOSIS — D492 Neoplasm of unspecified behavior of bone, soft tissue, and skin: Secondary | ICD-10-CM

## 2023-09-06 DIAGNOSIS — L570 Actinic keratosis: Secondary | ICD-10-CM

## 2023-09-06 DIAGNOSIS — L578 Other skin changes due to chronic exposure to nonionizing radiation: Secondary | ICD-10-CM

## 2023-09-06 DIAGNOSIS — D485 Neoplasm of uncertain behavior of skin: Secondary | ICD-10-CM

## 2023-09-06 DIAGNOSIS — D489 Neoplasm of uncertain behavior, unspecified: Secondary | ICD-10-CM

## 2023-09-06 DIAGNOSIS — L821 Other seborrheic keratosis: Secondary | ICD-10-CM | POA: Diagnosis not present

## 2023-09-06 DIAGNOSIS — W908XXA Exposure to other nonionizing radiation, initial encounter: Secondary | ICD-10-CM | POA: Diagnosis not present

## 2023-09-06 DIAGNOSIS — D099 Carcinoma in situ, unspecified: Secondary | ICD-10-CM

## 2023-09-06 HISTORY — DX: Carcinoma in situ, unspecified: D09.9

## 2023-09-06 NOTE — Progress Notes (Unsigned)
New Patient Visit   Subjective  Gary Andersen is a 77 y.o. male who presents for the following: multiple spots to be checked at arms and back. Spot at left cheek has been treated with LN2 twice about 10 years ago, itches and hurts when he scratches it. Patient advises the scab will fall off but it does come back. No hx of skin cancer.   The patient has spots, moles and lesions to be evaluated, some may be new or changing and the patient may have concern these could be cancer.   The following portions of the chart were reviewed this encounter and updated as appropriate: medications, allergies, medical history  Review of Systems:  No other skin or systemic complaints except as noted in HPI or Assessment and Plan.  Objective  Well appearing patient in no apparent distress; mood and affect are within normal limits.   A focused examination was performed of the following areas: Face, back, arms  Relevant exam findings are noted in the Assessment and Plan.  Left Upper Back Erythematous stuck-on, waxy papule or plaque  left zygoma 1.0 cm keratotic plaque  R/o SCC     right mid helix 0.6 cm pink scaly papule R/o SCC     L dorsal hand x 4, L arm x 8, R dorsal hand x 4, R forearm x 4 Erythematous thin papules/macules with gritty scale.   left infraorbital Keratotic papule 0.6 cm R/o SCC       Assessment & Plan   SEBORRHEIC KERATOSIS - Stuck-on, waxy, tan-brown papules and/or plaques  - Benign-appearing - Discussed benign etiology and prognosis. - Observe - Call for any changes  ACTINIC DAMAGE - chronic, secondary to cumulative UV radiation exposure/sun exposure over time - diffuse scaly erythematous macules with underlying dyspigmentation - Recommend daily broad spectrum sunscreen SPF 30+ to sun-exposed areas, reapply every 2 hours as needed.  - Recommend staying in the shade or wearing long sleeves, sun glasses (UVA+UVB protection) and wide brim hats (4-inch  brim around the entire circumference of the hat). - Call for new or changing lesions.   Inflamed seborrheic keratosis Left Upper Back  Symptomatic, irritating, patient would like treated.  Benign-appearing.  Call clinic for new or changing lesions.    Destruction of lesion - Left Upper Back Complexity: simple   Destruction method: cryotherapy   Informed consent: discussed and consent obtained   Timeout:  patient name, date of birth, surgical site, and procedure verified Lesion destroyed using liquid nitrogen: Yes   Region frozen until ice ball extended beyond lesion: Yes   Cryo cycles: 1 or 2. Outcome: patient tolerated procedure well with no complications   Post-procedure details: wound care instructions given    Neoplasm of uncertain behavior of skin (2) left zygoma  Skin / nail biopsy Type of biopsy: tangential   Informed consent: discussed and consent obtained   Timeout: patient name, date of birth, surgical site, and procedure verified   Procedure prep:  Patient was prepped and draped in usual sterile fashion Prep type:  Isopropyl alcohol Anesthesia: the lesion was anesthetized in a standard fashion   Anesthetic:  1% lidocaine w/ epinephrine 1-100,000 buffered w/ 8.4% NaHCO3 Instrument used: DermaBlade   Hemostasis achieved with: pressure and aluminum chloride   Outcome: patient tolerated procedure well   Post-procedure details: sterile dressing applied and wound care instructions given   Dressing type: bandage and petrolatum    Specimen 1 - Surgical pathology Differential Diagnosis: R/o SCC  Check Margins:  No 1.0 cm keratotic plaque    right mid helix  Skin / nail biopsy Type of biopsy: tangential   Informed consent: discussed and consent obtained   Timeout: patient name, date of birth, surgical site, and procedure verified   Procedure prep:  Patient was prepped and draped in usual sterile fashion Prep type:  Isopropyl alcohol Anesthesia: the lesion was  anesthetized in a standard fashion   Anesthetic:  1% lidocaine w/ epinephrine 1-100,000 buffered w/ 8.4% NaHCO3 Instrument used: DermaBlade   Hemostasis achieved with: pressure and aluminum chloride   Outcome: patient tolerated procedure well   Post-procedure details: sterile dressing applied and wound care instructions given   Dressing type: bandage and petrolatum    Specimen 2 - Surgical pathology Differential Diagnosis: R/o SCC  Check Margins: No 0.6 cm pink scaly papule   AK (actinic keratosis) L dorsal hand x 4, L arm x 8, R dorsal hand x 4, R forearm x 4  Actinic keratoses are precancerous spots that appear secondary to cumulative UV radiation exposure/sun exposure over time. They are chronic with expected duration over 1 year. A portion of actinic keratoses will progress to squamous cell carcinoma of the skin. It is not possible to reliably predict which spots will progress to skin cancer and so treatment is recommended to prevent development of skin cancer.  Recommend daily broad spectrum sunscreen SPF 30+ to sun-exposed areas, reapply every 2 hours as needed.  Recommend staying in the shade or wearing long sleeves, sun glasses (UVA+UVB protection) and wide brim hats (4-inch brim around the entire circumference of the hat). Call for new or changing lesions.   Destruction of lesion - L dorsal hand x 4, L arm x 8, R dorsal hand x 4, R forearm x 4 Complexity: simple   Destruction method: cryotherapy   Informed consent: discussed and consent obtained   Timeout:  patient name, date of birth, surgical site, and procedure verified Lesion destroyed using liquid nitrogen: Yes   Region frozen until ice ball extended beyond lesion: Yes   Cryo cycles: 1 or 2. Outcome: patient tolerated procedure well with no complications   Post-procedure details: wound care instructions given    Neoplasm of uncertain behavior left infraorbital  Skin / nail biopsy Type of biopsy: tangential    Informed consent: discussed and consent obtained   Timeout: patient name, date of birth, surgical site, and procedure verified   Procedure prep:  Patient was prepped and draped in usual sterile fashion Prep type:  Isopropyl alcohol Anesthesia: the lesion was anesthetized in a standard fashion   Anesthetic:  1% lidocaine w/ epinephrine 1-100,000 buffered w/ 8.4% NaHCO3 Instrument used: DermaBlade   Hemostasis achieved with: pressure and aluminum chloride   Outcome: patient tolerated procedure well   Post-procedure details: sterile dressing applied and wound care instructions given   Dressing type: bandage and petrolatum      Return for TBSE, with Dr. Katrinka Blazing, next available.  Anise Salvo, RMA, am acting as scribe for Elie Goody, MD .   Documentation: I have reviewed the above documentation for accuracy and completeness, and I agree with the above.  Elie Goody, MD

## 2023-09-06 NOTE — Patient Instructions (Signed)
Cryotherapy Aftercare  Wash gently with soap and water everyday.   Apply Vaseline and Band-Aid daily until healed.    Wound Care Instructions  Cleanse wound gently with soap and water once a day then pat dry with clean gauze. Apply a thin coat of Petrolatum (petroleum jelly, "Vaseline") over the wound (unless you have an allergy to this). We recommend that you use a new, sterile tube of Vaseline. Do not pick or remove scabs. Do not remove the yellow or white "healing tissue" from the base of the wound.  Cover the wound with fresh, clean, nonstick gauze and secure with paper tape. You may use Band-Aids in place of gauze and tape if the wound is small enough, but would recommend trimming much of the tape off as there is often too much. Sometimes Band-Aids can irritate the skin.  You should call the office for your biopsy report after 1 week if you have not already been contacted.  If you experience any problems, such as abnormal amounts of bleeding, swelling, significant bruising, significant pain, or evidence of infection, please call the office immediately.  FOR ADULT SURGERY PATIENTS: If you need something for pain relief you may take 1 extra strength Tylenol (acetaminophen) AND 2 Ibuprofen (200mg  each) together every 4 hours as needed for pain. (do not take these if you are allergic to them or if you have a reason you should not take them.) Typically, you may only need pain medication for 1 to 3 days.    Due to recent changes in healthcare laws, you may see results of your pathology and/or laboratory studies on MyChart before the doctors have had a chance to review them. We understand that in some cases there may be results that are confusing or concerning to you. Please understand that not all results are received at the same time and often the doctors may need to interpret multiple results in order to provide you with the best plan of care or course of treatment. Therefore, we ask that you  please give [place] 2 business days to thoroughly review all your results before contacting the office for clarification. Should we see a critical lab result, you will be contacted sooner.   If You Need Anything After Your Visit  If you have any questions or concerns for your doctor, please call our main line at [phone] and press option 4 to reach your doctor's medical assistant. If no one answers, please leave a voicemail as directed and we will return your call as soon as possible. Messages left after 4 pm will be answered the following business day.   You may also send [place] a message via MyChart. We typically respond to MyChart messages within 1-2 business days.  For prescription refills, please ask your pharmacy to contact our office. Our fax number is [phone].  If you have an urgent issue when the clinic is closed that cannot wait until the next business day, you can page your doctor at the number below.    Please note that while we do our best to be available for urgent issues outside of office hours, we are not available 24/7.   If you have an urgent issue and are unable to reach [place], you may choose to seek medical care at your doctor's office, retail clinic, urgent care center, or emergency room.  If you have a medical emergency, please immediately call 911 or go to the emergency department.  Pager Numbers  - Dr. Gwen Pounds: [phone]  -  Dr. Roseanne Reno: [phone]  - Dr. Katrinka Blazing: [phone]   In the event of inclement weather, please call our main line at [phone] for an update on the status of any delays or closures.  Dermatology Medication Tips: Please keep the boxes that topical medications come in in order to help keep track of the instructions about where and how to use these. Pharmacies typically print the medication instructions only on the boxes and not directly on the medication tubes.   If your medication is too expensive, please contact our office at  [phone] option 4 or send [place] a message through MyChart.   We are unable to tell what your co-pay for medications will be in advance as this is different depending on your insurance coverage. However, we may be able to find a substitute medication at lower cost or fill out paperwork to get insurance to cover a needed medication.   If a prior authorization is required to get your medication covered by your insurance company, please allow [place] 1-2 business days to complete this process.  Drug prices often vary depending on where the prescription is filled and some pharmacies may offer cheaper prices.  The website www.goodrx.com contains coupons for medications through different pharmacies. The prices here do not account for what the cost may be with help from insurance (it may be cheaper with your insurance), but the website can give you the price if you did not use any insurance.  - You can print the associated coupon and take it with your prescription to the pharmacy.  - You may also stop by our office during regular business hours and pick up a GoodRx coupon card.  - If you need your prescription sent electronically to a different pharmacy, notify our office through [facility] or by phone at [phone] option 4.

## 2023-09-12 LAB — SURGICAL PATHOLOGY

## 2023-09-26 ENCOUNTER — Encounter: Payer: Self-pay | Admitting: Dermatology

## 2023-09-26 ENCOUNTER — Ambulatory Visit: Payer: Medicare HMO | Admitting: Dermatology

## 2023-09-26 DIAGNOSIS — L820 Inflamed seborrheic keratosis: Secondary | ICD-10-CM | POA: Diagnosis not present

## 2023-09-26 DIAGNOSIS — L813 Cafe au lait spots: Secondary | ICD-10-CM

## 2023-09-26 DIAGNOSIS — D485 Neoplasm of uncertain behavior of skin: Secondary | ICD-10-CM

## 2023-09-26 DIAGNOSIS — D1801 Hemangioma of skin and subcutaneous tissue: Secondary | ICD-10-CM

## 2023-09-26 DIAGNOSIS — W908XXA Exposure to other nonionizing radiation, initial encounter: Secondary | ICD-10-CM

## 2023-09-26 DIAGNOSIS — Z872 Personal history of diseases of the skin and subcutaneous tissue: Secondary | ICD-10-CM

## 2023-09-26 DIAGNOSIS — D492 Neoplasm of unspecified behavior of bone, soft tissue, and skin: Secondary | ICD-10-CM

## 2023-09-26 DIAGNOSIS — L814 Other melanin hyperpigmentation: Secondary | ICD-10-CM | POA: Diagnosis not present

## 2023-09-26 DIAGNOSIS — L570 Actinic keratosis: Secondary | ICD-10-CM

## 2023-09-26 DIAGNOSIS — L578 Other skin changes due to chronic exposure to nonionizing radiation: Secondary | ICD-10-CM

## 2023-09-26 DIAGNOSIS — Z1283 Encounter for screening for malignant neoplasm of skin: Secondary | ICD-10-CM

## 2023-09-26 DIAGNOSIS — L821 Other seborrheic keratosis: Secondary | ICD-10-CM

## 2023-09-26 DIAGNOSIS — L720 Epidermal cyst: Secondary | ICD-10-CM

## 2023-09-26 DIAGNOSIS — D229 Melanocytic nevi, unspecified: Secondary | ICD-10-CM

## 2023-09-26 DIAGNOSIS — Z86007 Personal history of in-situ neoplasm of skin: Secondary | ICD-10-CM

## 2023-09-26 HISTORY — DX: Actinic keratosis: L57.0

## 2023-09-26 NOTE — Progress Notes (Signed)
Follow-Up Visit   Subjective  Gary Andersen is a 77 y.o. male who presents for the following: Skin Cancer Screening and Full Body Skin Exam  The patient presents for Total-Body Skin Exam (TBSE) for skin cancer screening and mole check. The patient has spots, moles and lesions to be evaluated, some may be new or changing and the patient may have concern these could be cancer.  Patient with hx SCCis, AK's.  Patient accompanied by wife who contributes to history.   The following portions of the chart were reviewed this encounter and updated as appropriate: medications, allergies, medical history  Review of Systems:  No other skin or systemic complaints except as noted in HPI or Assessment and Plan.  Objective  Well appearing patient in no apparent distress; mood and affect are within normal limits.  A full examination was performed including scalp, head, eyes, ears, nose, lips, neck, chest, axillae, abdomen, back, buttocks, bilateral upper extremities, bilateral lower extremities, hands, feet, fingers, toes, fingernails, and toenails. All findings within normal limits unless otherwise noted below.   Relevant physical exam findings are noted in the Assessment and Plan.  R dorsal hand x 3, R arm x 8, L hand x 2, L arm x 5 (18) Erythematous thin papules/macules with gritty scale.   Left Elbow - Posterior 10 mm pigmented pink patch     Left Forearm - Posterior 12 mm pink scar like macule    Assessment & Plan   SKIN CANCER SCREENING PERFORMED TODAY.  ACTINIC DAMAGE - Chronic condition, secondary to cumulative UV/sun exposure - diffuse scaly erythematous macules with underlying dyspigmentation - Recommend daily broad spectrum sunscreen SPF 30+ to sun-exposed areas, reapply every 2 hours as needed.  - Staying in the shade or wearing long sleeves, sun glasses (UVA+UVB protection) and wide brim hats (4-inch brim around the entire circumference of the hat) are also recommended for  sun protection.  - Call for new or changing lesions.  LENTIGINES, SEBORRHEIC KERATOSES, HEMANGIOMAS - Benign normal skin lesions - Benign-appearing - Call for any changes  MELANOCYTIC NEVI - Tan-brown and/or pink-flesh-colored symmetric macules and papules - Benign appearing on exam today - Observation - Call clinic for new or changing moles - Recommend daily use of broad spectrum spf 30+ sunscreen to sun-exposed areas.   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  - Call if any new or changing lesions are noted between office visits  EPIDERMAL INCLUSION CYST Exam: Subcutaneous nodule at left preauricular  Benign-appearing. Exam most consistent with an epidermal inclusion cyst. Discussed that a cyst is a benign growth that can grow over time and sometimes get irritated or inflamed. Recommend observation if it is not bothersome. Discussed option of surgical excision to remove it if it is growing, symptomatic, or other changes noted. Please call for new or changing lesions so they can be evaluated.  Cafe au Lait  - Tan patch at right low back - Genetic - Benign, observe - Call for any changes     AK (actinic keratosis) (18) R dorsal hand x 3, R arm x 8, L hand x 2, L arm x 5  Actinic keratoses are precancerous spots that appear secondary to cumulative UV radiation exposure/sun exposure over time. They are chronic with expected duration over 1 year.  A portion of actinic keratoses will progress to squamous cell carcinoma of the skin. It is not possible to reliably predict which spots will progress to skin cancer and so treatment is recommended to prevent development of skin cancer.  Recommend daily broad spectrum sunscreen SPF 30+ to sun-exposed areas, reapply every 2  hours as needed.  Recommend staying in the shade or wearing long sleeves, sun glasses (UVA+UVB protection) and wide brim hats (4-inch brim around the entire circumference of the hat). Call for new or changing lesions.   Destruction of lesion - R dorsal hand x 3, R arm x 8, L hand x 2, L arm x 5 (18) Complexity: simple   Destruction method: cryotherapy   Informed consent: discussed and consent obtained   Timeout:  patient name, date of birth, surgical site, and procedure verified Lesion destroyed using liquid nitrogen: Yes   Region frozen until ice ball extended beyond lesion: Yes   Cryo cycles: 1 or 2. Outcome: patient tolerated procedure well with no complications   Post-procedure details: wound care instructions given    Neoplasm of uncertain behavior of skin (2) Left Elbow - Posterior  Skin / nail biopsy Type of biopsy: tangential   Informed consent: discussed and consent obtained   Timeout: patient name, date of birth, surgical site, and procedure verified   Procedure prep:  Patient was prepped and draped in usual sterile fashion Prep type:  Isopropyl alcohol Anesthesia: the lesion was anesthetized in a standard fashion   Anesthetic:  1% lidocaine w/ epinephrine 1-100,000 buffered w/ 8.4% NaHCO3 Instrument used: DermaBlade   Hemostasis achieved with: pressure and aluminum chloride   Outcome: patient tolerated procedure well   Post-procedure details: sterile dressing applied and wound care instructions given   Dressing type: bandage and petrolatum    Specimen 2 - Surgical pathology Differential Diagnosis: AK vs melanoma  Check Margins: No  Left Forearm - Posterior  Skin / nail biopsy Type of biopsy: tangential   Informed consent: discussed and consent obtained   Timeout: patient name, date of birth, surgical site, and procedure verified   Procedure prep:  Patient was prepped and draped in usual sterile fashion Prep type:  Isopropyl alcohol Anesthesia: the lesion was  anesthetized in a standard fashion   Anesthetic:  1% lidocaine w/ epinephrine 1-100,000 buffered w/ 8.4% NaHCO3 Instrument used: DermaBlade   Hemostasis achieved with: pressure and aluminum chloride   Outcome: patient tolerated procedure well   Post-procedure details: sterile dressing applied and wound care instructions given   Dressing type: bandage and petrolatum    Specimen 1 - Surgical pathology Differential Diagnosis: BCC vs SCC vs AK  Check Margins: No  Seborrheic keratoses  Multiple benign nevi  Lentigines  Actinic elastosis  Cherry angioma   Return in about 6 months (around 03/25/2024) for TBSE, Hx SCCis, Hx AK.  Anise Salvo, RMA, am acting as scribe for Elie Goody, MD .   Documentation: I have reviewed the above documentation for accuracy and completeness, and I agree with the above.  Elie Goody, MD

## 2023-09-26 NOTE — Patient Instructions (Addendum)
Cryotherapy Aftercare  Wash gently with soap and water everyday.   Apply Vaseline and Band-Aid daily until healed.   Wound Care Instructions  Cleanse wound gently with soap and water once a day then pat dry with clean gauze. Apply a thin coat of Petrolatum (petroleum jelly, "Vaseline") over the wound (unless you have an allergy to this). We recommend that you use a new, sterile tube of Vaseline. Do not pick or remove scabs. Do not remove the yellow or white "healing tissue" from the base of the wound.  Cover the wound with fresh, clean, nonstick gauze and secure with paper tape. You may use Band-Aids in place of gauze and tape if the wound is small enough, but would recommend trimming much of the tape off as there is often too much. Sometimes Band-Aids can irritate the skin.  You should call the office for your biopsy report after 1 week if you have not already been contacted.  If you experience any problems, such as abnormal amounts of bleeding, swelling, significant bruising, significant pain, or evidence of infection, please call the office immediately.  FOR ADULT SURGERY PATIENTS: If you need something for pain relief you may take 1 extra strength Tylenol (acetaminophen) AND 2 Ibuprofen (200mg  each) together every 4 hours as needed for pain. (do not take these if you are allergic to them or if you have a reason you should not take them.) Typically, you may only need pain medication for 1 to 3 days.   Melanoma ABCDEs  Melanoma is the most dangerous type of skin cancer, and is the leading cause of death from skin disease.  You are more likely to develop melanoma if you: Have light-colored skin, light-colored eyes, or red or blond hair Spend a lot of time in the sun Tan regularly, either outdoors or in a tanning bed Have had blistering sunburns, especially during childhood Have a close family member who has had a melanoma Have atypical moles or large birthmarks  Early detection of  melanoma is key since treatment is typically straightforward and cure rates are extremely high if we catch it early.   The first sign of melanoma is often a change in a mole or a new dark spot.  The ABCDE system is a way of remembering the signs of melanoma.  A for asymmetry:  The two halves do not match. B for border:  The edges of the growth are irregular. C for color:  A mixture of colors are present instead of an even brown color. D for diameter:  Melanomas are usually (but not always) greater than 6mm - the size of a pencil eraser. E for evolution:  The spot keeps changing in size, shape, and color.  Please check your skin once per month between visits. You can use a small mirror in front and a large mirror behind you to keep an eye on the back side or your body.   If you see any new or changing lesions before your next follow-up, please call to schedule a visit.  Please continue daily skin protection including broad spectrum sunscreen SPF 30+ to sun-exposed areas, reapplying every 2 hours as needed when you're outdoors.    Due to recent changes in healthcare laws, you may see results of your pathology and/or laboratory studies on MyChart before the doctors have had a chance to review them. We understand that in some cases there may be results that are confusing or concerning to you. Please understand that not all results  are received at the same time and often the doctors may need to interpret multiple results in order to provide you with the best plan of care or course of treatment. Therefore, we ask that you please give [place] 2 business days to thoroughly review all your results before contacting the office for clarification. Should we see a critical lab result, you will be contacted sooner.   If You Need Anything After Your Visit  If you have any questions or concerns for your doctor, please call our main line at [phone] and press option 4 to reach your doctor's medical assistant. If  no one answers, please leave a voicemail as directed and we will return your call as soon as possible. Messages left after 4 pm will be answered the following business day.   You may also send [place] a message via MyChart. We typically respond to MyChart messages within 1-2 business days.  For prescription refills, please ask your pharmacy to contact our office. Our fax number is [phone].  If you have an urgent issue when the clinic is closed that cannot wait until the next business day, you can page your doctor at the number below.    Please note that while we do our best to be available for urgent issues outside of office hours, we are not available 24/7.   If you have an urgent issue and are unable to reach [place], you may choose to seek medical care at your doctor's office, retail clinic, urgent care center, or emergency room.  If you have a medical emergency, please immediately call 911 or go to the emergency department.  Pager Numbers  - Dr. Gwen Pounds: [phone]  - Dr. Roseanne Reno: [phone]  - Dr. Katrinka Blazing: [phone]   In the event of inclement weather, please call our main line at [phone] for an update on the status of any delays or closures.  Dermatology Medication Tips: Please keep the boxes that topical medications come in in order to help keep track of the instructions about where and how to use these. Pharmacies typically print the medication instructions only on the boxes and not directly on the medication tubes.   If your medication is too expensive, please contact our office at [phone] option 4 or send [place] a message through MyChart.   We are unable to tell what your co-pay for medications will be in advance as this is different depending on your insurance coverage. However, we may be able to find a substitute medication at lower cost or fill out paperwork to get insurance to cover a needed medication.   If a prior authorization is required to get your medication  covered by your insurance company, please allow [place] 1-2 business days to complete this process.  Drug prices often vary depending on where the prescription is filled and some pharmacies may offer cheaper prices.  The website www.goodrx.com contains coupons for medications through different pharmacies. The prices here do not account for what the cost may be with help from insurance (it may be cheaper with your insurance), but the website can give you the price if you did not use any insurance.  - You can print the associated coupon and take it with your prescription to the pharmacy.  - You may also stop by our office during regular business hours and pick up a GoodRx coupon card.  - If you need your prescription sent electronically to a different pharmacy, notify our office through [facility] or by phone at [phone]  option 4.

## 2023-09-30 LAB — SURGICAL PATHOLOGY

## 2023-10-03 ENCOUNTER — Telehealth: Payer: Self-pay

## 2023-10-03 NOTE — Telephone Encounter (Signed)
-----   Message from [facility] sent at 10/01/2023  3:35 PM EST ----- Diagnosis:  1. Skin, left forearm - posterior :       LICHENOID ACTINIC KERATOSIS        2. Skin, left elbow - posterior :       PIGMENTED SEBORRHEIC KERATOSIS, EARLY, INFLAMED    Plan: please call to share biopsy results. Left forearm biopsy shows an inflamed precancer and left elbow biopsy shows an inflamed benign thickening of the top layer of skin. There was no skin cancer in either biopsy. Precancers typically resolve after being biopsied so no further treatment is needed.  Thank you

## 2023-11-14 ENCOUNTER — Telehealth: Payer: Self-pay

## 2023-11-14 NOTE — Telephone Encounter (Signed)
Left pt msg to call for bx results/sh 

## 2023-11-14 NOTE — Telephone Encounter (Signed)
-----   Message from [facility] sent at 10/01/2023  3:35 PM EST ----- Diagnosis:  1. Skin, left forearm - posterior :       LICHENOID ACTINIC KERATOSIS        2. Skin, left elbow - posterior :       PIGMENTED SEBORRHEIC KERATOSIS, EARLY, INFLAMED    Plan: please call to share biopsy results. Left forearm biopsy shows an inflamed precancer and left elbow biopsy shows an inflamed benign thickening of the top layer of skin. There was no skin cancer in either biopsy. Precancers typically resolve after being biopsied so no further treatment is needed.  Thank you

## 2024-02-02 ENCOUNTER — Ambulatory Visit (INDEPENDENT_AMBULATORY_CARE_PROVIDER_SITE_OTHER): Payer: Medicare HMO | Admitting: Urology

## 2024-02-02 VITALS — BP 162/77 | HR 60 | Ht 71.0 in | Wt 194.4 lb

## 2024-02-02 DIAGNOSIS — Z125 Encounter for screening for malignant neoplasm of prostate: Secondary | ICD-10-CM

## 2024-02-02 DIAGNOSIS — N401 Enlarged prostate with lower urinary tract symptoms: Secondary | ICD-10-CM | POA: Diagnosis not present

## 2024-02-02 DIAGNOSIS — N390 Urinary tract infection, site not specified: Secondary | ICD-10-CM | POA: Diagnosis not present

## 2024-02-02 LAB — BLADDER SCAN AMB NON-IMAGING: Scan Result: 32

## 2024-02-02 LAB — URINALYSIS, COMPLETE
Bilirubin, UA: NEGATIVE
Leukocytes,UA: NEGATIVE
Nitrite, UA: POSITIVE — AB
Protein,UA: NEGATIVE
RBC, UA: NEGATIVE
Specific Gravity, UA: 1.025 (ref 1.005–1.030)
Urobilinogen, Ur: 1 mg/dL (ref 0.2–1.0)
pH, UA: 5.5 (ref 5.0–7.5)

## 2024-02-02 LAB — MICROSCOPIC EXAMINATION

## 2024-02-02 MED ORDER — TAMSULOSIN HCL 0.4 MG PO CAPS: 0.4000 mg | ORAL_CAPSULE | Freq: Every day | ORAL | 11 refills | Status: AC

## 2024-02-02 NOTE — Progress Notes (Signed)
 Marcelle Overlie Plume,acting as a scribe for Vanna Scotland, MD.,have documented all relevant documentation on the behalf of Vanna Scotland, MD,as directed by  Vanna Scotland, MD while in the presence of Vanna Scotland, MD.  02/02/24 11:17 AM   Gary Andersen 07-Feb-1946 [id number]  Referring provider: Armando Gang, FNP [address] [place],  [place] [address]  Chief Complaint  Patient presents with   Establish Care   Benign Prostatic Hypertrophy    HPI:  78 y/o male referred by Franco Nones, FNP for further evaluation of BPH with urinary symptoms. He has a personal history of UTIs and previous interventions for urethral stricture.  He has a history of urethral trauma from a cystoscopy procedure performed many years ago, which resulted in scar tissue formation. Approximately 8-9 years ago, he underwent a laser procedure to remove scar tissue, which temporarily improved his symptoms.  He reports a weak urinary stream and incomplete voiding, which has been progressively worsening. He is dissatisfied with his urinary symptoms and is currently on Finasteride for BPH. He reports dribbling post-urination and is concerned about wearing incontinence pads. He denies recent UTIs but mentions elevated nitrates in a recent urinalysis.  He has not been screened for prostate cancer in 25 years.   He is a smoker and has a family history of no cancer.  Results for orders placed or performed in visit on 02/02/24  BLADDER SCAN AMB NON-IMAGING  Result Value Ref Range   Scan Result 32 ml     IPSS     Row Name 02/02/24 1000         International Prostate Symptom Score   How often have you had the sensation of not emptying your bladder? More than half the time     How often have you had to urinate less than every two hours? Not at All     How often have you found you stopped and started again several times when you urinated? Not at All     How often have you found it difficult to  postpone urination? Less than 1 in 5 times     How often have you had a weak urinary stream? Almost always     How often have you had to strain to start urination? Less than 1 in 5 times     How many times did you typically get up at night to urinate? None     Total IPSS Score 11       Quality of Life due to urinary symptoms   If you were to spend the rest of your life with your urinary condition just the way it is now how would you feel about that? 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 If you have any questions, ask your nurse or doctor.          STOP taking these medications    azithromycin 250 MG tablet Commonly known as: Zithromax Z-Pak   Mitigare 0.6 MG Caps Generic drug: Colchicine   tetrahydrozoline 0.05 % ophthalmic solution   vitamin D3 25 MCG tablet Commonly known as: CHOLECALCIFEROL       TAKE these medications    atorvastatin 40 MG tablet Commonly known as: LIPITOR Take 40 mg by mouth in the morning.   clopidogrel 75 MG tablet Commonly known as: PLAVIX Take 75 mg by mouth in the morning.   finasteride 5 MG tablet Commonly known as: PROSCAR Take 5 mg by mouth in the morning.   tamsulosin 0.4 MG Caps capsule Commonly known as: FLOMAX Take 1 capsule (0.4 mg total) by mouth daily.        Family History: Family History  Problem Relation Age of Onset   Thyroid disease Mother    Diabetes Father    Breast cancer Maternal Aunt     Social History:  reports that he has been smoking cigarettes. He has a 63 pack-year smoking history. He has never used smokeless tobacco. He reports that he does not currently use alcohol. He reports that he does not use drugs.   Physical Exam: BP (!) 162/77   Pulse 60   Ht 5\' 11"  (1.803 m)   Wt 194 lb 6 oz (88.2 kg)   BMI 27.11 kg/m   Constitutional:  Alert and oriented, No acute distress. HEENT: [place] AT, moist mucus membranes.  Trachea midline, no masses. GU: Prostate is enlarged with a small nodule on the right side, rubbery in texture. No other abnormalities noted. Neurologic: Grossly intact, no focal deficits, moving all 4 extremities. Psychiatric: Normal mood and affect.   Assessment & Plan:    1. BPH with LUTS - He presents with a history of BPH and current symptoms of weak urinary stream and incomplete voiding. He is currently on Finasteride.  - DRE revealed an enlarged prostate with a nodule on the right side.  - The plan is to initiate Flomax to help alleviate  urinary symptoms by relaxing the bladder neck and prostate.  - He is advised to take Flomax at night with food due to potential side effects such as dizziness.  - The combination of Finasteride and Flomax is expected to provide synergistic benefits.  - A follow-up cystoscopy is scheduled in one month to assess for scar tissue and evaluate the bladder, given his smoking history and risk for bladder cancer.  2. Prostate Cancer Screening - Given his age and history, a PSA test is ordered today  to rule out prostate cancer, which can present with similar symptoms to BPH.  - He has not been screened for prostate cancer in 25 years, and the presence of a nodule on the prostate warrants further investigation.  3.  Urinary Tract Infections - He reports a history of UTIs, with the last episode occurring a month ago.  - His urinalysis today is negative but nitrate positive, indicating possible bacterial presence. - - No current symptoms of UTI are reported, and no antibiotics are prescribed at this time. - He is advised to monitor for any signs of infection and report them promptly.  Return in about 1 month (around 03/04/2024) for cystoscopy.  I have reviewed the above documentation for accuracy and completeness, and I agree with the above.   Vanna Scotland, MD   [facility] [address] [place], [place] [address] [phone]

## 2024-02-03 LAB — PSA: Prostate Specific Ag, Serum: 0.4 ng/mL (ref 0.0–4.0)

## 2024-03-20 ENCOUNTER — Ambulatory Visit (INDEPENDENT_AMBULATORY_CARE_PROVIDER_SITE_OTHER): Admitting: Urology

## 2024-03-20 VITALS — BP 151/88 | HR 60 | Ht 71.75 in | Wt 193.0 lb

## 2024-03-20 DIAGNOSIS — R350 Frequency of micturition: Secondary | ICD-10-CM

## 2024-03-20 DIAGNOSIS — N401 Enlarged prostate with lower urinary tract symptoms: Secondary | ICD-10-CM

## 2024-03-20 LAB — MICROSCOPIC EXAMINATION: Epithelial Cells (non renal): >10 /HPF — AB (ref 0–10)

## 2024-03-20 LAB — URINALYSIS, COMPLETE
Bilirubin, UA: NEGATIVE
Ketones, UA: NEGATIVE
Leukocytes,UA: NEGATIVE
Nitrite, UA: POSITIVE — AB
Protein,UA: NEGATIVE
RBC, UA: NEGATIVE
Specific Gravity, UA: 1.015 (ref 1.005–1.030)
Urobilinogen, Ur: 1 mg/dL (ref 0.2–1.0)
pH, UA: 6 (ref 5.0–7.5)

## 2024-03-20 NOTE — Progress Notes (Signed)
   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  He has been managed on finasteride.  At last visit he was started on Flomax  which has not made much of a difference.  See previous notes for details.  Over the last 2 days he is felt a "tickle" in his bladder area.  He has also been peeing more frequently.  He wonders if he has an infection.  His urine is bland other than the presence of epithelial cells and instantly nitrate positive.  He denies using Pyridium.  Blood pressure (!) 151/88, pulse 60, height 5' 11.75" (1.822 m), weight 193 lb (87.5 kg). NED. A&Ox3.   No respiratory distress   Abd soft, NT, ND Normal phallus with bilateral descended testicles  Cystoscopy Procedure Note  Patient identification was confirmed, informed consent was obtained, and patient was prepped using Betadine solution.  Lidocaine  jelly was administered per urethral meatus.     Pre-Procedure: - Inspection reveals a normal caliber ureteral meatus.  Procedure: The flexible cystoscope was introduced without difficulty - 3-4 concentric urethral strictures but at least 20 Fr in diameter - Enlarged prostate  - Normal bladder neck - Bilateral ureteral orifices identified - Bladder mucosa  reveals no ulcers, tumors, or lesions - No bladder stones - Moderate trabeculation with saccules  Retroflexion shows unremarkable   Post-Procedure: - Patient tolerated the procedure well  Assessment/ Plan:  1. Benign prostatic hyperplasia with lower urinary tract symptoms, symptom details unspecified (Primary) Refractory urinary symptoms despite maximal medical management  Cystoscopy today does show saccules and trabeculation consistent with chronic outlet obstruction.  That being said, his bulbar urethral structures do not appear to be clinically significant and the scope was  easily passable.  I will have him follow-up with my partners, either Dr. Estanislao Heimlich or [facility]  to discuss further options for his presumed BPH as underlying cause of urinary symptoms. - Urinalysis, Complete - CULTURE, URINE COMPREHENSIVE  2. Frequency of urination As above  In light of the "tickle and positive nitrites, will send urine culture today. - CULTURE, URINE COMPREHENSIVE    Dustin Gimenez, MD

## 2024-03-23 LAB — CULTURE, URINE COMPREHENSIVE

## 2024-04-02 ENCOUNTER — Ambulatory Visit: Payer: Medicare HMO | Admitting: Dermatology

## 2024-04-02 ENCOUNTER — Encounter: Payer: Self-pay | Admitting: Dermatology

## 2024-04-02 DIAGNOSIS — L814 Other melanin hyperpigmentation: Secondary | ICD-10-CM | POA: Diagnosis not present

## 2024-04-02 DIAGNOSIS — D492 Neoplasm of unspecified behavior of bone, soft tissue, and skin: Secondary | ICD-10-CM

## 2024-04-02 DIAGNOSIS — W908XXA Exposure to other nonionizing radiation, initial encounter: Secondary | ICD-10-CM

## 2024-04-02 DIAGNOSIS — L570 Actinic keratosis: Secondary | ICD-10-CM

## 2024-04-02 DIAGNOSIS — D485 Neoplasm of uncertain behavior of skin: Secondary | ICD-10-CM

## 2024-04-02 DIAGNOSIS — L578 Other skin changes due to chronic exposure to nonionizing radiation: Secondary | ICD-10-CM

## 2024-04-02 DIAGNOSIS — D1801 Hemangioma of skin and subcutaneous tissue: Secondary | ICD-10-CM

## 2024-04-02 DIAGNOSIS — Z86007 Personal history of in-situ neoplasm of skin: Secondary | ICD-10-CM

## 2024-04-02 DIAGNOSIS — Z1283 Encounter for screening for malignant neoplasm of skin: Secondary | ICD-10-CM

## 2024-04-02 DIAGNOSIS — L821 Other seborrheic keratosis: Secondary | ICD-10-CM

## 2024-04-02 DIAGNOSIS — Z7189 Other specified counseling: Secondary | ICD-10-CM

## 2024-04-02 DIAGNOSIS — D229 Melanocytic nevi, unspecified: Secondary | ICD-10-CM

## 2024-04-02 MED ORDER — FLUOROURACIL 5 % EX CREA: TOPICAL_CREAM | Freq: Two times a day (BID) | CUTANEOUS | 0 refills | Status: AC

## 2024-04-02 NOTE — Progress Notes (Addendum)
 Follow-Up Visit   Subjective  Gary Andersen is a 78 y.o. male who presents for the following: Skin Cancer Screening and Full Body Skin Exam  The patient presents for Total-Body Skin Exam (TBSE) for skin cancer screening and mole check. The patient has spots, moles and lesions to be evaluated, some may be new or changing and the patient may have concern these could be cancer.    The following portions of the chart were reviewed this encounter and updated as appropriate: medications, allergies, medical history  Review of Systems:  No other skin or systemic complaints except as noted in HPI or Assessment and Plan.  Objective  Well appearing patient in no apparent distress; mood and affect are within normal limits.  A full examination was performed including scalp, head, eyes, ears, nose, lips, neck, chest, axillae, abdomen, back, buttocks, bilateral upper extremities, bilateral lower extremities, hands, feet, fingers, All findings within normal limits unless otherwise noted below. Declines feet to be examined.  Relevant physical exam findings are noted in the Assessment and Plan.  L zygoma (bx proven), temples forehead dorsal hands Erythematous thin papules/macules with gritty scale.   R helix, L anti helix (2) Erythematous thin papules/macules with gritty scale.      Assessment & Plan   SKIN CANCER SCREENING PERFORMED TODAY.  ACTINIC DAMAGE - Chronic condition, secondary to cumulative UV/sun exposure - diffuse scaly erythematous macules with underlying dyspigmentation - Recommend daily broad spectrum sunscreen SPF 30+ to sun-exposed areas, reapply every 2 hours as needed.  - Staying in the shade or wearing long sleeves, sun glasses (UVA+UVB protection) and wide brim hats (4-inch brim around the entire circumference of the hat) are also recommended for sun protection.  - Call for new or changing lesions.  LENTIGINES, SEBORRHEIC KERATOSES, HEMANGIOMAS - Benign normal skin  lesions - Benign-appearing - Call for any changes  MELANOCYTIC NEVI - Tan-brown and/or pink-flesh-colored symmetric macules and papules - Benign appearing on exam today - Observation - Call clinic for new or changing moles - Recommend daily use of broad spectrum spf 30+ sunscreen to sun-exposed areas.   HISTORY OF SQUAMOUS CELL CARCINOMA IN SITU OF THE SKIN - No evidence of recurrence today - Recommend regular full body skin exams - Recommend daily broad spectrum sunscreen SPF 30+ to sun-exposed areas, reapply every 2 hours as needed.  - Call if any new or changing lesions are noted between office visits AK (ACTINIC KERATOSIS) L zygoma (bx proven), temples forehead dorsal hands Bx proven - Start 5FU BID until reaction occurs on L zygoma, L antihelix, R helix. When those areas are clear, treat temples, forehead, dorsal hands  ACTINIC DAMAGE WITH PRECANCEROUS ACTINIC KERATOSES Counseling for Topical Chemotherapy Management: Patient exhibits: - Severe, confluent actinic changes with pre-cancerous actinic keratoses that is secondary to cumulative UV radiation exposure over time - Condition that is severe; chronic, not at goal. - diffuse scaly erythematous macules and papules with underlying dyspigmentation - Discussed Prescription "Field Treatment" topical Chemotherapy for Severe, Chronic Confluent Actinic Changes with Pre-Cancerous Actinic Keratoses Field treatment involves treatment of an entire area of skin that has confluent Actinic Changes (Sun/ Ultraviolet light damage) and PreCancerous Actinic Keratoses by method of PhotoDynamic Therapy (PDT) and/or prescription Topical Chemotherapy agents such as 5-fluorouracil , 5-fluorouracil /calcipotriene, and/or imiquimod.  The purpose is to decrease the number of clinically evident and subclinical PreCancerous lesions to prevent progression to development of skin cancer by chemically destroying early precancer changes that may or may not be  visible.  It has been shown to reduce the risk of developing skin cancer in the treated area. As a result of treatment, redness, scaling, crusting, and open sores may occur during treatment course. One or more than one of these methods may be used and may have to be used several times to control, suppress and eliminate the PreCancerous changes. Discussed treatment course, expected reaction, and possible side effects. - Recommend daily broad spectrum sunscreen SPF 30+ to sun-exposed areas, reapply every 2 hours as needed.  - Staying in the shade or wearing long sleeves, sun glasses (UVA+UVB protection) and wide brim hats (4-inch brim around the entire circumference of the hat) are also recommended. - Call for new or changing lesions.  NEOPLASM OF UNCERTAIN BEHAVIOR OF SKIN (2) R helix, L anti helix (2) fluorouracil  (EFUDEX ) 5 % cream Apply topically 2 (two) times daily. Apply to the L side of face, R ear, and L ear BID until reaction occurs. AK vs SCC -   Start 5FU cream BID until reaction occurs. Reviewed course of treatment and expected reaction.  Patient advised to expect inflammation and crusting and advised that erosions are possible.  Patient advised to be diligent with sun protection during and after treatment. Handout with details of how to apply medication and what to expect provided. Counseled to keep medication out of reach of children and pets.  LENTIGINES   MULTIPLE BENIGN NEVI   SEBORRHEIC KERATOSES   ACTINIC ELASTOSIS   CHERRY ANGIOMA   Return in about 3 months (around 07/03/2024) for recheck of lesions treated with 5FU.  Arlinda Lais, CMA, am acting as scribe for Harris Liming, MD .   Documentation: I have reviewed the above documentation for accuracy and completeness, and I agree with the above.  Harris Liming, MD

## 2024-04-02 NOTE — Patient Instructions (Addendum)
 5-Fluorouracil  Patient Education   Actinic keratoses are the dry, red scaly spots on the skin caused by sun damage. A portion of these spots can turn into skin cancer with time, and treating them can help prevent development of skin cancer.   Treatment of these spots requires removal of the defective skin cells. There are various ways to remove actinic keratoses, including freezing with liquid nitrogen, treatment with creams, or treatment with a blue light procedure in the office.   5-fluorouracil  cream is a topical cream used to treat actinic keratoses. It works by interfering with the growth of abnormal fast-growing skin cells, such as actinic keratoses. These cells peel off and are replaced by healthy ones. THIS CREAM SHOULD BE KEPT OUT OF REACH OF CHILDREN AND PETS AND SHOULD NOT BE USED BY PREGNANT WOMEN.  INSTRUCTIONS FOR 5-FLUOROURACIL  CREAM:   5-fluorouracil  cream twice daily until reaction occurs to the left side of the face and both ears. Avoid contact with your eyes or nostrils. Avoid applying the cream to your eyelids or lips unless directed to apply there by your physician. Do not use 5-fluorouracil  on infected or open wounds.   You will develop redness, irritation and some crusting at areas where you have pre-cancer damage/actinic keratoses. IF YOU DEVELOP PAIN, BLEEDING, OR SIGNIFICANT CRUSTING, STOP THE TREATMENT EARLY - you have already gotten a good response and the actinic keratoses should clear up well.  Wash your hands after applying 5-fluorouracil  5% cream on your skin.   A moisturizer or sunscreen with a minimum SPF 30 should be applied each morning.   Once you have finished the treatment, you can apply a thin layer of Vaseline twice a day to irritated areas to soothe and calm the areas more quickly. If you experience significant discomfort, contact your physician.  For some patients it is necessary to repeat the treatment for best results.  SIDE EFFECTS: When using  5-fluorouracil  cream, you may have mild irritation, such as redness, dryness, swelling, or a mild burning sensation. This usually resolves within 2 weeks. The more actinic keratoses you have, the more redness and inflammation you can expect during treatment. Eye irritation has been reported rarely. If this occurs, please let us  know.   If you have any trouble using this cream, please send us  a MyChart Message or call the office. If you have any other questions about this information, please do not hesitate to ask me before you leave the office or reach out on MyChart or by phone.    Due to recent changes in healthcare laws, you may see results of your pathology and/or laboratory studies on MyChart before the doctors have had a chance to review them. We understand that in some cases there may be results that are confusing or concerning to you. Please understand that not all results are received at the same time and often the doctors may need to interpret multiple results in order to provide you with the best plan of care or course of treatment. Therefore, we ask that you please give us  2 business days to thoroughly review all your results before contacting the office for clarification. Should we see a critical lab result, you will be contacted sooner.   If You Need Anything After Your Visit  If you have any questions or concerns for your doctor, please call our main line at [phone] and press option 4 to reach your doctor's medical assistant. If no one answers, please leave a voicemail as directed and we  will return your call as soon as possible. Messages left after 4 pm will be answered the following business day.   You may also send us  a message via MyChart. We typically respond to MyChart messages within 1-2 business days.  For prescription refills, please ask your pharmacy to contact our office. Our fax number is [phone].  If you have an urgent issue when the clinic is closed that cannot  wait until the next business day, you can page your doctor at the number below.    Please note that while we do our best to be available for urgent issues outside of office hours, we are not available 24/7.   If you have an urgent issue and are unable to reach us , you may choose to seek medical care at your doctor's office, retail clinic, urgent care center, or emergency room.  If you have a medical emergency, please immediately call 911 or go to the emergency department.  Pager Numbers  - Dr. Bary Likes: [phone]  - Dr. Annette Barters: [phone]  - Dr. Felipe Horton: [phone]   In the event of inclement weather, please call our main line at [phone] for an update on the status of any delays or closures.  Dermatology Medication Tips: Please keep the boxes that topical medications come in in order to help keep track of the instructions about where and how to use these. Pharmacies typically print the medication instructions only on the boxes and not directly on the medication tubes.   If your medication is too expensive, please contact our office at [phone] option 4 or send us  a message through MyChart.   We are unable to tell what your co-pay for medications will be in advance as this is different depending on your insurance coverage. However, we may be able to find a substitute medication at lower cost or fill out paperwork to get insurance to cover a needed medication.   If a prior authorization is required to get your medication covered by your insurance company, please allow us  1-2 business days to complete this process.  Drug prices often vary depending on where the prescription is filled and some pharmacies may offer cheaper prices.  The website www.goodrx.com contains coupons for medications through different pharmacies. The prices here do not account for what the cost may be with help from insurance (it may be cheaper with your insurance), but the website can give you the price  if you did not use any insurance.  - You can print the associated coupon and take it with your prescription to the pharmacy.  - You may also stop by our office during regular business hours and pick up a GoodRx coupon card.  - If you need your prescription sent electronically to a different pharmacy, notify our office through [facility] or by phone at [phone] option 4.     Si Usted Necesita Algo Despus de Su Visita  Tambin puede enviarnos un mensaje a travs de Clinical cytogeneticist. Por lo general respondemos a los mensajes de MyChart en el transcurso de 1 a 2 das hbiles.  Para renovar recetas, por favor pida a su farmacia que se ponga en contacto con nuestra oficina. Franz Jacks de fax es [place] [phone].  Si tiene un asunto urgente cuando la clnica est cerrada y que no puede esperar hasta el siguiente da hbil, puede llamar/localizar a su doctor(a) al nmero que aparece a continuacin.   Por favor, tenga en cuenta que aunque hacemos todo lo posible para estar disponibles  para asuntos urgentes fuera del horario de [place], no estamos disponibles las 24 horas del da, los 7 [address]  [address] de la [place].   Si tiene un problema urgente y no puede comunicarse con nosotros, puede optar por buscar atencin mdica  en el consultorio de su doctor(a), en una clnica privada, en un centro de atencin urgente o en una sala de emergencias.  Si tiene Engineer, drilling, por favor llame inmediatamente al 911 o vaya a la sala de emergencias.  Nmeros de bper  - Dr. Bary Likes: [phone]  - Dra. Annette Barters: [phone]  - Dr. Felipe Horton: [phone]   En caso de inclemencias del tiempo, por favor llame a Lajuan Pila principal al [phone] para una actualizacin sobre el [place] de cualquier retraso o cierre.  Consejos para la medicacin en dermatologa: Por favor, guarde las cajas en las que vienen los medicamentos de uso tpico para ayudarle a seguir las instrucciones sobre dnde y cmo  usarlos. Las farmacias generalmente imprimen las instrucciones del medicamento slo en las cajas y no directamente en los tubos del [place].   Si su medicamento es muy caro, por favor, pngase en contacto con Bettyjane Brunet llamando al [phone] y presione la opcin 4 o envenos un mensaje a travs de Clinical cytogeneticist.   No podemos decirle cul ser su copago por los medicamentos por adelantado ya que esto es diferente dependiendo de la cobertura de su seguro. Sin embargo, es posible que podamos encontrar un medicamento sustituto a Audiological scientist un formulario para que el seguro cubra el medicamento que se considera necesario.   Si se requiere una autorizacin previa para que su compaa de seguros [place] su medicamento, por favor permtanos de 1 a 2 das hbiles para completar este proceso.  Los precios de los medicamentos varan con frecuencia dependiendo del Environmental consultant de dnde se surte la receta y alguna farmacias pueden ofrecer precios ms baratos.  El sitio web www.goodrx.com tiene cupones para medicamentos de Health and safety inspector. Los precios aqu no tienen en cuenta lo que podra costar con la ayuda del seguro (puede ser ms barato con su seguro), pero el sitio web puede darle el precio si no utiliz Tourist information centre manager.  - Puede imprimir el cupn correspondiente y llevarlo con su receta a la farmacia.  - Tambin puede pasar por nuestra oficina durante el horario de atencin regular y Education officer, museum una tarjeta de cupones de GoodRx.  - Si necesita que su receta se enve electrnicamente a una farmacia diferente, informe a nuestra oficina a travs de MyChart de [place] o por telfono llamando al [phone] y presione la opcin 4.

## 2024-04-26 ENCOUNTER — Encounter: Payer: Self-pay | Admitting: Urology

## 2024-04-26 ENCOUNTER — Ambulatory Visit (INDEPENDENT_AMBULATORY_CARE_PROVIDER_SITE_OTHER): Admitting: Urology

## 2024-04-26 VITALS — BP 137/63 | HR 66 | Ht 71.75 in | Wt 193.0 lb

## 2024-04-26 DIAGNOSIS — N401 Enlarged prostate with lower urinary tract symptoms: Secondary | ICD-10-CM | POA: Diagnosis not present

## 2024-04-26 DIAGNOSIS — R350 Frequency of micturition: Secondary | ICD-10-CM

## 2024-04-26 LAB — BLADDER SCAN AMB NON-IMAGING: Scan Result: 29

## 2024-04-26 NOTE — Progress Notes (Signed)
   04/26/2024 1:17 PM   Gary Andersen 1946-08-08 [id number]  Reason for visit: Postvoid dribbling  HPI: 78 year old male who was previously seen by Dr. Ace Holder and transferred his care to me after she left the practice.  His history notable for reported history of urethral stricture.  He underwent cystoscopy with Dr. Ace Holder in May 2025 that showed some narrowing of the urethra but scope passed easily into the bladder, moderate size prostate, bladder with moderate to severe trabeculations but no suspicious lesions.  His primary complaint is postvoid dribbling.  He drinks at least 1 or 2 pots of coffee during the day.  He does not have any nocturia.  He has a small amount of leakage that does not require a pad or depends after he uses the bathroom.  PVRs of voice been normal, urine culture negative.  He is on Flomax  and finasteride.  I personally viewed and interpreted the CT from 2022 and prostate measures 31 g.  I think we need to have reasonable expectations with his age and other comorbidities.  I recommended starting with a Cunningham clamp or Weisner clamp to see if this helps with the postvoid dribbling, and he could consider decreasing the amount of coffee and caffeine he takes in.  We discussed outlet procedures like UroLift or HOLEP, but high suspicion this would not improve his postvoid dribbling significantly, and would have potential to exacerbate his urinary symptoms.  If he did feel strongly about intervention would start with UroLift for decreased risk of incontinence.  Recommended Cunningham clamp for postvoid leakage Can continue finasteride and Flomax  RTC 3 months urinalysis, PVR   Lawerence Pressman, MD  [facility] [address], [address] [place], [place] [address] [phone]

## 2024-04-26 NOTE — Patient Instructions (Addendum)
 I would recommend a Cunningham clamp or Weisner clamp to help with your postvoid leakage  Many people find your symptoms improved by cutting back on coffee or any sodas or diet drinks  You are emptying your bladder appropriately  [facility] Address: [address] [place], [place], [place] [address] Phone: [phone]  [facility] Address: [address] [place], [place], [place] [address] Phone: [phone]

## 2024-05-30 ENCOUNTER — Encounter: Payer: Self-pay | Admitting: Urology

## 2024-07-03 ENCOUNTER — Ambulatory Visit: Admitting: Dermatology

## 2024-07-03 ENCOUNTER — Encounter: Payer: Self-pay | Admitting: Dermatology

## 2024-07-03 DIAGNOSIS — L570 Actinic keratosis: Secondary | ICD-10-CM | POA: Diagnosis not present

## 2024-07-03 DIAGNOSIS — L720 Epidermal cyst: Secondary | ICD-10-CM

## 2024-07-03 DIAGNOSIS — Z7189 Other specified counseling: Secondary | ICD-10-CM

## 2024-07-03 DIAGNOSIS — L821 Other seborrheic keratosis: Secondary | ICD-10-CM

## 2024-07-03 DIAGNOSIS — Z9861 Coronary angioplasty status: Secondary | ICD-10-CM | POA: Insufficient documentation

## 2024-07-03 DIAGNOSIS — L578 Other skin changes due to chronic exposure to nonionizing radiation: Secondary | ICD-10-CM

## 2024-07-03 DIAGNOSIS — Z872 Personal history of diseases of the skin and subcutaneous tissue: Secondary | ICD-10-CM

## 2024-07-03 DIAGNOSIS — W908XXA Exposure to other nonionizing radiation, initial encounter: Secondary | ICD-10-CM

## 2024-07-03 NOTE — Progress Notes (Signed)
 Follow-Up Visit   Subjective  Gary Andersen is a 78 y.o. male who presents for the following: Recheck bx proven Ak on the L zygoma, temples, forehead, forearms, and dorsal hands. Pt did use 5FU for about a week and experienced redness and irritation. Lesion on the L zygoma and L ear are smoother, but the R ear still has some roughness.   The following portions of the chart were reviewed this encounter and updated as appropriate: medications, allergies, medical history  Review of Systems:  No other skin or systemic complaints except as noted in HPI or Assessment and Plan.  Objective  Well appearing patient in no apparent distress; mood and affect are within normal limits.   A focused examination was performed of the following areas: the face and ears    Relevant exam findings are noted in the Assessment and Plan.    Assessment & Plan   HISTORY OF PRECANCEROUS ACTINIC KERATOSIS - L zygoma, bx proven, S/P 5FU, clear - site(s) of PreCancerous Actinic Keratosis clear today. - these may recur and new lesions may form requiring treatment to prevent transformation into skin cancer - observe for new or changing spots and contact [facility] for appointment if occur - photoprotection with sun protective clothing; sunglasses and broad spectrum sunscreen with SPF of at least 30 + and frequent self skin exams recommended - yearly exams by a dermatologist recommended for persons with history of PreCancerous Actinic Keratoses  AK (ACTINIC KERATOSIS) R ear, dorsal hands Bx proven - Start 5FU BID until reaction occurs on the R ear and dorsal hands, forearms.   ACTINIC DAMAGE WITH PRECANCEROUS ACTINIC KERATOSES, improving but not at goal Counseling for Topical Chemotherapy Management: Patient exhibits: - Severe, confluent actinic changes with pre-cancerous actinic keratoses that is secondary to cumulative UV radiation exposure over time - Condition that is severe; chronic, not at  goal. - diffuse scaly erythematous macules and papules with underlying dyspigmentation - Discussed Prescription Field Treatment topical Chemotherapy for Severe, Chronic Confluent Actinic Changes with Pre-Cancerous Actinic Keratoses Field treatment involves treatment of an entire area of skin that has confluent Actinic Changes (Sun/ Ultraviolet light damage) and PreCancerous Actinic Keratoses by method of PhotoDynamic Therapy (PDT) and/or prescription Topical Chemotherapy agents such as 5-fluorouracil , 5-fluorouracil /calcipotriene, and/or imiquimod.  The purpose is to decrease the number of clinically evident and subclinical PreCancerous lesions to prevent progression to development of skin cancer by chemically destroying early precancer changes that may or may not be visible.  It has been shown to reduce the risk of developing skin cancer in the treated area. As a result of treatment, redness, scaling, crusting, and open sores may occur during treatment course. One or more than one of these methods may be used and may have to be used several times to control, suppress and eliminate the PreCancerous changes. Discussed treatment course, expected reaction, and possible side effects. - Recommend daily broad spectrum sunscreen SPF 30+ to sun-exposed areas, reapply every 2 hours as needed.  - Staying in the shade or wearing long sleeves, sun glasses (UVA+UVB protection) and wide brim hats (4-inch brim around the entire circumference of the hat) are also recommended. - Call for new or changing lesions.   SEBORRHEIC KERATOSIS - Stuck-on, waxy, tan-brown papules and/or plaques  - Benign-appearing - Discussed benign etiology and prognosis. - Observe - Call for any changes  EPIDERMAL INCLUSION CYST Exam: 1.5 x 1.6 cm Subcutaneous nodule at L preauricular  Benign-appearing. Exam most consistent with an epidermal inclusion  cyst. Discussed that a cyst is a benign growth that can grow over time and sometimes  get irritated or inflamed. Recommend observation if it is not bothersome. Discussed option of surgical excision to remove it if it is growing, symptomatic, or other changes noted. Please call for new or changing lesions so they can be evaluate. Please call your insurance and ask if an epidermoid cyst surgery would be covered. The billing codes would be 11442 and [id number]. ACTINIC ELASTOSIS   ACTINIC KERATOSES   EIC (EPIDERMAL INCLUSION CYST)   SEBORRHEIC KERATOSES    Return in about 3 months (around 10/03/2024) for TBSE; surgery/cyst excision.  LILLETTE Rosina Mayans, CMA, am acting as scribe for Boneta Sharps, MD .   Documentation: I have reviewed the above documentation for accuracy and completeness, and I agree with the above.  Boneta Sharps, MD

## 2024-07-03 NOTE — Patient Instructions (Addendum)
 Please call your insurance and ask if an epidermoid cyst surgery would be covered. The billing codes would be 11442 and 87947  Start fluorouracil  5% cream twice daily until reaction occurs on the right ear and dorsal hands.   Pre-Operative Instructions You are scheduled for a surgical procedure at [facility]. We recommend you read the following instructions. If you have any questions or concerns, please call the office at [phone].  Shower and wash the entire body with soap and water the day of your surgery paying special attention to cleansing at and around the planned surgery site.  Please continue to take your anticoagulants (blood thinners) as you normally     would before and after surgery if they were prescribed by a medical provider. Stopping them could be harmful to you. We have multiple tools in dermatology to stop the bleeding even if you take an anticoagulant. If you take over the counter blood thinner such as aspirin , Ibuprofen (Motrin, Advil and Nuprin), Naprosyn, Voltaren, Relafen, etc. that was not prescribed or recommended by a medical provider, we recommend that you stop taking it for a week before your surgery and wait to restart until 2 days after your surgery.  Please inform us  of all medications you are currently taking. All medications that are taken regularly should be taken the day of surgery as you always do. Nevertheless, we need to be informed of what medications you are taking prior to surgery to know whether they will affect the procedure or cause any complications.   Please inform us  of any medication allergies. Also inform us  of whether you have allergies to Latex or rubber products or whether you have had any adverse reaction to Lidocaine  or Epinephrine .  Please inform us  of any prosthetic or artificial body parts such as artificial heart valve, joint replacements, etc., or similar condition that might require preoperative antibiotics.   We recommend  avoidance of alcohol at least two weeks prior to surgery and continued avoidance for at least two weeks after surgery.   We recommend discontinuation of tobacco smoking at least two weeks prior to surgery and continued abstinence for at least two weeks after surgery.  Do not plan strenuous exercise, strenuous work or strenuous lifting for approximately four weeks after your surgery.   We request if you are unable to make your scheduled surgical appointment, please call us  at least a week in advance or as soon as you are aware of a problem so that we can cancel or reschedule the appointment.   You MAY TAKE TYLENOL  (acetaminophen ) for pain as it is not a blood thinner.   PLEASE PLAN TO BE IN TOWN FOR TWO WEEKS FOLLOWING SURGERY, THIS IS IMPORTANT SO YOU CAN BE CHECKED FOR DRESSING CHANGES, FUTURE REMOVAL AND TO MONITOR FOR POSSIBLE COMPLICATIONS.    Due to recent changes in healthcare laws, you may see results of your pathology and/or laboratory studies on MyChart before the doctors have had a chance to review them. We understand that in some cases there may be results that are confusing or concerning to you. Please understand that not all results are received at the same time and often the doctors may need to interpret multiple results in order to provide you with the best plan of care or course of treatment. Therefore, we ask that you please give us  2 business days to thoroughly review all your results before contacting the office for clarification. Should we see a critical lab result, you will be  contacted sooner.   If You Need Anything After Your Visit  If you have any questions or concerns for your doctor, please call our main line at [phone] and press option 4 to reach your doctor's medical assistant. If no one answers, please leave a voicemail as directed and we will return your call as soon as possible. Messages left after 4 pm will be answered the following business day.   You may  also send us  a message via MyChart. We typically respond to MyChart messages within 1-2 business days.  For prescription refills, please ask your pharmacy to contact our office. Our fax number is [phone].  If you have an urgent issue when the clinic is closed that cannot wait until the next business day, you can page your doctor at the number below.    Please note that while we do our best to be available for urgent issues outside of office hours, we are not available 24/7.   If you have an urgent issue and are unable to reach us , you may choose to seek medical care at your doctor's office, retail clinic, urgent care center, or emergency room.  If you have a medical emergency, please immediately call 911 or go to the emergency department.  Pager Numbers  - Dr. Hester: [phone]  - Dr. Jackquline: [phone]  - Dr. Claudene: [phone]   In the event of inclement weather, please call our main line at [phone] for an update on the status of any delays or closures.  Dermatology Medication Tips: Please keep the boxes that topical medications come in in order to help keep track of the instructions about where and how to use these. Pharmacies typically print the medication instructions only on the boxes and not directly on the medication tubes.   If your medication is too expensive, please contact our office at [phone] option 4 or send us  a message through MyChart.   We are unable to tell what your co-pay for medications will be in advance as this is different depending on your insurance coverage. However, we may be able to find a substitute medication at lower cost or fill out paperwork to get insurance to cover a needed medication.   If a prior authorization is required to get your medication covered by your insurance company, please allow us  1-2 business days to complete this process.  Drug prices often vary depending on where the prescription is filled and some  pharmacies may offer cheaper prices.  The website www.goodrx.com contains coupons for medications through different pharmacies. The prices here do not account for what the cost may be with help from insurance (it may be cheaper with your insurance), but the website can give you the price if you did not use any insurance.  - You can print the associated coupon and take it with your prescription to the pharmacy.  - You may also stop by our office during regular business hours and pick up a GoodRx coupon card.  - If you need your prescription sent electronically to a different pharmacy, notify our office through [facility] or by phone at [phone] option 4.     Si Usted Necesita Algo Despus de Su Visita  Tambin puede enviarnos un mensaje a travs de Clinical cytogeneticist. Por lo general respondemos a los mensajes de MyChart en el transcurso de 1 a 2 das hbiles.  Para renovar recetas, por favor pida a su farmacia que se ponga en contacto con nuestra oficina. Nuestro nmero de fax  es el [phone].  Si tiene un asunto urgente cuando la clnica est cerrada y que no puede esperar hasta el siguiente da hbil, puede llamar/localizar a su doctor(a) al nmero que aparece a continuacin.   Por favor, tenga en cuenta que aunque hacemos todo lo posible para estar disponibles para asuntos urgentes fuera del horario de [place], no estamos disponibles las 24 horas del da, los 7 [address]  [address] de la [place].   Si tiene un problema urgente y no puede comunicarse con nosotros, puede optar por buscar atencin mdica  en el consultorio de su doctor(a), en una clnica privada, en un centro de atencin urgente o en una sala de emergencias.  Si tiene Engineer, drilling, por favor llame inmediatamente al 911 o vaya a la sala de emergencias.  Nmeros de bper  - Dr. Hester: [phone]  - Dra. Jackquline: [phone]  - Dr. Claudene: [phone]   En caso de inclemencias del tiempo, por favor llame a landry capes principal al [phone] para una actualizacin sobre el [place] de cualquier retraso o cierre.  Consejos para la medicacin en dermatologa: Por favor, guarde las cajas en las que vienen los medicamentos de uso tpico para ayudarle a seguir las instrucciones sobre dnde y cmo usarlos. Las farmacias generalmente imprimen las instrucciones del medicamento slo en las cajas y no directamente en los tubos del [place].   Si su medicamento es muy caro, por favor, pngase en contacto con landry rieger llamando al [phone] y presione la opcin 4 o envenos un mensaje a travs de Clinical cytogeneticist.   No podemos decirle cul ser su copago por los medicamentos por adelantado ya que esto es diferente dependiendo de la cobertura de su seguro. Sin embargo, es posible que podamos encontrar un medicamento sustituto a Audiological scientist un formulario para que el seguro cubra el medicamento que se considera necesario.   Si se requiere una autorizacin previa para que su compaa de seguros malta su medicamento, por favor permtanos de 1 a 2 das hbiles para completar este proceso.  Los precios de los medicamentos varan con frecuencia dependiendo del Environmental consultant de dnde se surte la receta y alguna farmacias pueden ofrecer precios ms baratos.  El sitio web www.goodrx.com tiene cupones para medicamentos de Health and safety inspector. Los precios aqu no tienen en cuenta lo que podra costar con la ayuda del seguro (puede ser ms barato con su seguro), pero el sitio web puede darle el precio si no utiliz Tourist information centre manager.  - Puede imprimir el cupn correspondiente y llevarlo con su receta a la farmacia.  - Tambin puede pasar por nuestra oficina durante el horario de atencin regular y Education officer, museum una tarjeta de cupones de GoodRx.  - Si necesita que su receta se enve electrnicamente a una farmacia diferente, informe a nuestra oficina a travs de MyChart de [place] o por telfono llamando al [phone] y presione la  opcin 4.

## 2024-08-01 ENCOUNTER — Ambulatory Visit: Admitting: Urology

## 2024-08-01 DIAGNOSIS — N401 Enlarged prostate with lower urinary tract symptoms: Secondary | ICD-10-CM

## 2024-08-01 DIAGNOSIS — R350 Frequency of micturition: Secondary | ICD-10-CM

## 2024-08-02 ENCOUNTER — Ambulatory Visit: Admitting: Urology

## 2024-10-04 ENCOUNTER — Ambulatory Visit (INDEPENDENT_AMBULATORY_CARE_PROVIDER_SITE_OTHER): Admitting: Dermatology

## 2024-10-04 ENCOUNTER — Encounter: Payer: Self-pay | Admitting: Dermatology

## 2024-10-04 DIAGNOSIS — D1801 Hemangioma of skin and subcutaneous tissue: Secondary | ICD-10-CM

## 2024-10-04 DIAGNOSIS — D229 Melanocytic nevi, unspecified: Secondary | ICD-10-CM

## 2024-10-04 DIAGNOSIS — W908XXA Exposure to other nonionizing radiation, initial encounter: Secondary | ICD-10-CM | POA: Diagnosis not present

## 2024-10-04 DIAGNOSIS — Z1283 Encounter for screening for malignant neoplasm of skin: Secondary | ICD-10-CM | POA: Diagnosis not present

## 2024-10-04 DIAGNOSIS — B351 Tinea unguium: Secondary | ICD-10-CM

## 2024-10-04 DIAGNOSIS — L814 Other melanin hyperpigmentation: Secondary | ICD-10-CM | POA: Diagnosis not present

## 2024-10-04 DIAGNOSIS — L578 Other skin changes due to chronic exposure to nonionizing radiation: Secondary | ICD-10-CM

## 2024-10-04 DIAGNOSIS — Z86007 Personal history of in-situ neoplasm of skin: Secondary | ICD-10-CM

## 2024-10-04 DIAGNOSIS — L821 Other seborrheic keratosis: Secondary | ICD-10-CM

## 2024-10-04 NOTE — Patient Instructions (Signed)

## 2024-10-04 NOTE — Progress Notes (Signed)
   Follow-Up Visit   Subjective  Geremiah Fussell is a 78 y.o. male who presents for the following: Skin Cancer Screening and Full Body Skin Exam. Hx of SCCis. Hx of AKs.   The patient presents for Total-Body Skin Exam (TBSE) for skin cancer screening and mole check. The patient has spots, moles and lesions to be evaluated, some may be new or changing and the patient may have concern these could be cancer.    The following portions of the chart were reviewed this encounter and updated as appropriate: medications, allergies, medical history  Review of Systems:  No other skin or systemic complaints except as noted in HPI or Assessment and Plan.  Objective  Well appearing patient in no apparent distress; mood and affect are within normal limits.  A full examination was performed including scalp, head, eyes, ears, nose, lips, neck, chest, axillae, abdomen, back, buttocks, bilateral upper extremities, bilateral lower extremities, hands, feet, fingers, toes, fingernails, and toenails. All findings within normal limits unless otherwise noted below.   Relevant physical exam findings are noted in the Assessment and Plan.    Assessment & Plan   SKIN CANCER SCREENING PERFORMED TODAY.  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 08/2023 - No evidence of recurrence today - Recommend regular full body skin exams - Recommend daily broad spectrum sunscreen SPF 30+ to sun-exposed areas, reapply every 2 hours as needed.  - Call if any new or changing lesions are noted between office visits   ACTINIC DAMAGE - Chronic condition, secondary to cumulative UV/sun exposure - diffuse scaly erythematous macules with underlying dyspigmentation - Recommend daily broad spectrum sunscreen SPF 30+ to sun-exposed areas, reapply every 2 hours as needed.  - Staying in the shade or wearing long sleeves, sun glasses (UVA+UVB protection) and wide brim hats (4-inch brim around the entire  circumference of the hat) are also recommended for sun protection.  - Call for new or changing lesions.  LENTIGINES, SEBORRHEIC KERATOSES, HEMANGIOMAS - Benign normal skin lesions - Benign-appearing - Call for any changes  MELANOCYTIC NEVI - Tan-brown and/or pink-flesh-colored symmetric macules and papules - Benign appearing on exam today - Observation - Call clinic for new or changing moles - Recommend daily use of broad spectrum spf 30+ sunscreen to sun-exposed areas.   ONYCHOMYCOSIS Exam: Thickened toenails with subungal debris c/w onychomycosis Hx of oral Lamisil in the past. States nothing helps. Cleared a little with oral Lamisil but recurred.  Chronic and persistent condition with duration or expected duration over one year. Condition is symptomatic/ bothersome to patient. Not currently at goal.  Treatment Plan:  Patient deferred treatment at this time.    MULTIPLE BENIGN NEVI   LENTIGINES   SEBORRHEIC KERATOSES   ACTINIC ELASTOSIS   CHERRY ANGIOMA   ONYCHOMYCOSIS   Return in about 6 months (around 04/03/2025) for TBSE, HxSCCis.  I, Jill Parcell, CMA, am acting as scribe for Boneta Sharps, MD.   Documentation: I have reviewed the above documentation for accuracy and completeness, and I agree with the above.  Boneta Sharps, MD

## 2025-04-04 ENCOUNTER — Ambulatory Visit: Admitting: Dermatology
